# Patient Record
Sex: Male | Born: 1940
Health system: Southern US, Community
[De-identification: ages and names within clinical notes are randomized; demographics above are authoritative.]

## PROBLEM LIST (undated history)

## (undated) DIAGNOSIS — M109 Gout, unspecified: Secondary | ICD-10-CM

## (undated) DIAGNOSIS — M722 Plantar fascial fibromatosis: Secondary | ICD-10-CM

## (undated) DIAGNOSIS — K219 Gastro-esophageal reflux disease without esophagitis: Secondary | ICD-10-CM

## (undated) DIAGNOSIS — C439 Malignant melanoma of skin, unspecified: Secondary | ICD-10-CM

## (undated) DIAGNOSIS — J309 Allergic rhinitis, unspecified: Secondary | ICD-10-CM

## (undated) DIAGNOSIS — R0602 Shortness of breath: Secondary | ICD-10-CM

## (undated) DIAGNOSIS — E785 Hyperlipidemia, unspecified: Secondary | ICD-10-CM

## (undated) DIAGNOSIS — N529 Male erectile dysfunction, unspecified: Secondary | ICD-10-CM

## (undated) DIAGNOSIS — E119 Type 2 diabetes mellitus without complications: Secondary | ICD-10-CM

## (undated) DIAGNOSIS — I4891 Unspecified atrial fibrillation: Secondary | ICD-10-CM

## (undated) DIAGNOSIS — I10 Essential (primary) hypertension: Secondary | ICD-10-CM

## (undated) DIAGNOSIS — M1990 Unspecified osteoarthritis, unspecified site: Secondary | ICD-10-CM

## (undated) DIAGNOSIS — M545 Low back pain, unspecified: Secondary | ICD-10-CM

## (undated) DIAGNOSIS — J439 Emphysema, unspecified: Secondary | ICD-10-CM

## (undated) DIAGNOSIS — IMO0001 Reserved for inherently not codable concepts without codable children: Secondary | ICD-10-CM

## (undated) DIAGNOSIS — J449 Chronic obstructive pulmonary disease, unspecified: Secondary | ICD-10-CM

## (undated) DIAGNOSIS — F17200 Nicotine dependence, unspecified, uncomplicated: Secondary | ICD-10-CM

## (undated) HISTORY — DX: Nicotine dependence, unspecified, uncomplicated: F17.200

## (undated) HISTORY — DX: Essential (primary) hypertension: I10

## (undated) HISTORY — DX: Emphysema, unspecified: J43.9

## (undated) HISTORY — DX: Reserved for inherently not codable concepts without codable children: IMO0001

## (undated) HISTORY — DX: Chronic obstructive pulmonary disease, unspecified: J44.9

## (undated) HISTORY — DX: Male erectile dysfunction, unspecified: N52.9

## (undated) HISTORY — DX: Gastro-esophageal reflux disease without esophagitis: K21.9

## (undated) HISTORY — PX: CATARACT EXTRACTION: SUR2

## (undated) HISTORY — PX: LITHOTRIPSY: SUR834

## (undated) HISTORY — PX: EYE SURGERY: SHX253

## (undated) HISTORY — DX: Type 2 diabetes mellitus without complications: E11.9

## (undated) HISTORY — DX: Plantar fascial fibromatosis: M72.2

## (undated) HISTORY — DX: Gout, unspecified: M10.9

## (undated) HISTORY — DX: Allergic rhinitis, unspecified: J30.9

## (undated) HISTORY — PX: FRACTURE SURGERY: SHX138

## (undated) HISTORY — DX: Low back pain, unspecified: M54.50

## (undated) HISTORY — DX: Unspecified osteoarthritis, unspecified site: M19.90

## (undated) HISTORY — DX: Unspecified atrial fibrillation: I48.91

## (undated) HISTORY — DX: Low back pain: M54.5

## (undated) HISTORY — DX: Shortness of breath: R06.02

## (undated) HISTORY — DX: Malignant melanoma of skin, unspecified: C43.9

---

## 1999-08-28 ENCOUNTER — Encounter: Payer: Self-pay | Admitting: Rheumatology

## 1999-08-28 ENCOUNTER — Encounter: Admission: RE | Admit: 1999-08-28 | Discharge: 1999-08-28 | Payer: Self-pay | Admitting: Rheumatology

## 2002-06-07 DIAGNOSIS — M722 Plantar fascial fibromatosis: Secondary | ICD-10-CM

## 2002-06-07 HISTORY — DX: Plantar fascial fibromatosis: M72.2

## 2003-07-09 ENCOUNTER — Encounter: Admission: RE | Admit: 2003-07-09 | Discharge: 2003-07-09 | Payer: Self-pay | Admitting: Orthopaedic Surgery

## 2003-07-13 ENCOUNTER — Encounter: Admission: RE | Admit: 2003-07-13 | Discharge: 2003-07-13 | Payer: Self-pay | Admitting: Orthopaedic Surgery

## 2010-01-05 DIAGNOSIS — C439 Malignant melanoma of skin, unspecified: Secondary | ICD-10-CM

## 2010-01-05 HISTORY — PX: OTHER SURGICAL HISTORY: SHX169

## 2010-01-05 HISTORY — DX: Malignant melanoma of skin, unspecified: C43.9

## 2012-02-06 HISTORY — PX: OTHER SURGICAL HISTORY: SHX169

## 2012-06-07 DIAGNOSIS — I4891 Unspecified atrial fibrillation: Secondary | ICD-10-CM

## 2012-06-07 HISTORY — DX: Unspecified atrial fibrillation: I48.91

## 2013-03-26 ENCOUNTER — Emergency Department
Admission: EM | Admit: 2013-03-26 | Discharge: 2013-03-26 | Disposition: A | Discharge status: Home / Self Care | Payer: Medicare Other | Source: Home / Self Care | Attending: Family Medicine | Admitting: Family Medicine

## 2013-03-26 ENCOUNTER — Encounter: Payer: Self-pay | Admitting: Emergency Medicine

## 2013-03-26 ENCOUNTER — Emergency Department (INDEPENDENT_AMBULATORY_CARE_PROVIDER_SITE_OTHER): Payer: Medicare Other

## 2013-03-26 DIAGNOSIS — M25469 Effusion, unspecified knee: Secondary | ICD-10-CM

## 2013-03-26 DIAGNOSIS — M25561 Pain in right knee: Secondary | ICD-10-CM

## 2013-03-26 DIAGNOSIS — M25569 Pain in unspecified knee: Secondary | ICD-10-CM

## 2013-03-26 HISTORY — DX: Essential (primary) hypertension: I10

## 2013-03-26 HISTORY — DX: Hyperlipidemia, unspecified: E78.5

## 2013-03-26 MED ORDER — DICLOFENAC EPOLAMINE 1.3 % TD PTCH: MEDICATED_PATCH | TRANSDERMAL | Status: DC

## 2013-03-26 NOTE — ED Notes (Signed)
Lance Davenport c/o right knee pain intermittent without injury x 2-3 months. Pain radiates to toes at times.

## 2013-03-26 NOTE — ED Provider Notes (Signed)
CSN: [id number]     Arrival date & time 03/26/13  1210 History   First MD Initiated Contact with Patient 03/26/13 1236     Chief Complaint  Patient presents with  . Knee Pain     HPI Comments: Patient complains of intermittent pain in his right knee for about 3 months.  The pain increased about 2 weeks ago, with swelling of the knee but no warmth.  The pain improved, then became worse again 3 days ago.  The pain is worse at night.  He recalls no trauma to the knee.  The knee occasionally feels as if it may give way.  He has tried small doses of Aleve without inprovement. He has a past history of right femur fracture with ORIF.  Patient is a 72 y.o. male presenting with knee pain. The history is provided by the patient.  Knee Pain Location:  Knee Time since incident:  3 months Injury: no   Knee location:  R knee Pain details:    Quality:  Aching   Radiates to: toes.   Severity:  Mild   Onset quality:  Gradual   Duration:  3 months   Timing:  Intermittent   Progression:  Worsening Chronicity:  Recurrent Prior injury to area:  No Relieved by:  Nothing Worsened by:  Bearing weight and activity Ineffective treatments:  NSAIDs Associated symptoms: stiffness and swelling   Associated symptoms: no back pain, no decreased ROM, no fever, no muscle weakness, no numbness and no tingling     Past Medical History  Diagnosis Date  . Hypertension   . Hyperlipidemia    Past Surgical History  Procedure Laterality Date  . Fracture surgery      left leg with Pin  . Lithotripsy     Family History  Problem Relation Age of Onset  . Hypertension Mother   . Diabetes Mother   . Hypertension Father    History  Substance Use Topics  . Smoking status: Former Games developer  . Smokeless tobacco: Never Used  . Alcohol Use: Yes    Review of Systems  Constitutional: Negative for fever.  Musculoskeletal: Positive for stiffness. Negative for back pain.  All other systems reviewed and are  negative.    Allergies  Review of patient's allergies indicates no known allergies.  Home Medications   Current Outpatient Rx  Name  Route  Sig  Dispense  Refill  . allopurinol (ZYLOPRIM) 100 MG tablet   Oral   Take 100 mg by mouth daily.         . dabigatran (PRADAXA) 150 MG CAPS capsule   Oral   Take 150 mg by mouth every 12 (twelve) hours.         Marland Kitchen diltiazem (TIAZAC) 240 MG 24 hr capsule   Oral   Take 240 mg by mouth daily.         . rosuvastatin (CRESTOR) 5 MG tablet   Oral   Take 5 mg by mouth daily.         . valsartan (DIOVAN) 160 MG tablet   Oral   Take 160 mg by mouth daily.         . diclofenac (FLECTOR) 1.3 % PTCH      Apply one-half to one patch to right knee at bedtime.  Take off 12 hours later.   5 patch   0    BP 169/83  Pulse 76  Resp 16  Ht 6' (1.829 m)  Wt 265 lb (120.203  kg)  BMI 35.93 kg/m2  SpO2 96% Physical Exam  Nursing note and vitals reviewed. Constitutional: He is oriented to person, place, and time. He appears well-developed and well-nourished. No distress.  Patient is obese (BMI 35.9)  HENT:  Head: Normocephalic.  Mouth/Throat: Oropharynx is clear and moist.  Eyes: Conjunctivae are normal. Pupils are equal, round, and reactive to light.  Cardiovascular: Normal heart sounds.   Pulmonary/Chest: Breath sounds normal.  Musculoskeletal: Normal range of motion.       Right knee: He exhibits normal range of motion, no swelling, no effusion, no ecchymosis, no deformity, no laceration, no erythema, normal alignment, no LCL laxity, normal patellar mobility, no bony tenderness, normal meniscus and no MCL laxity. Tenderness found. Medial joint line tenderness noted. No lateral joint line, no MCL, no LCL and no patellar tendon tenderness noted.  Right knee has mild vague tenderness over the medial joint line.  Knee stable.  Negative McMurray test.  By inspection, the right lower extremity appears to be approximately 1.5cm longer  than the left.  Neurological: He is alert and oriented to person, place, and time.  Skin: Skin is warm and dry. No rash noted.    ED Course  Procedures  none    Imaging Review Dg Knee Complete 4 Views Right  03/26/2013   CLINICAL DATA:  Swelling, posterior knee pain  EXAM: RIGHT KNEE - COMPLETE 4+ VIEW  COMPARISON:  None.  FINDINGS: Four views of right knee submitted. No acute fracture or subluxation. Narrowing of patellofemoral joint space. Mild spurring of patella.  IMPRESSION: No acute fracture or subluxation. Mild degenerative changes.   Electronically Signed   By: Natasha Mead M.D.   On: 03/26/2013 13:18      MDM   1. Right knee pain; suspect DJD, possibly a result of leg length discrepancy    Applied hinged knee brace. Begin a trial of topically applied Flector patch at bedtime, which should result in minimal systemic absorption of diclofenac(Rx #5, with no refill) Followup with [facility] if not improving about two weeks.     Lattie Haw, MD 03/26/13 [phone]

## 2013-03-29 ENCOUNTER — Encounter: Payer: Self-pay | Admitting: Sports Medicine

## 2013-03-29 ENCOUNTER — Ambulatory Visit (INDEPENDENT_AMBULATORY_CARE_PROVIDER_SITE_OTHER): Payer: Medicare Other | Admitting: Sports Medicine

## 2013-03-29 VITALS — BP 147/77 | HR 92 | Wt 266.0 lb

## 2013-03-29 DIAGNOSIS — IMO0002 Reserved for concepts with insufficient information to code with codable children: Secondary | ICD-10-CM

## 2013-03-29 DIAGNOSIS — M1710 Unilateral primary osteoarthritis, unspecified knee: Secondary | ICD-10-CM

## 2013-03-29 DIAGNOSIS — M1711 Unilateral primary osteoarthritis, right knee: Secondary | ICD-10-CM

## 2013-03-29 DIAGNOSIS — M170 Bilateral primary osteoarthritis of knee: Secondary | ICD-10-CM | POA: Insufficient documentation

## 2013-03-29 MED ORDER — MELOXICAM 15 MG PO TABS: ORAL_TABLET | ORAL | Status: DC

## 2013-03-29 NOTE — Progress Notes (Signed)
  Subjective:    CC: Right knee pain and swelling  HPI:  This is a very pleasant 72 year old male, he has a history of knee osteoarthritis, unfortunately he developed increasing pain, swelling. He has tried some oral analgesics and anti-inflammatories without improvement. Pain is localized at the joint lines, no mechanical symptoms, worse with weight bearing, no radiation. Pain is moderate, persistent.  Past medical history, Surgical history, Family history not pertinant except as noted below, Social history, Allergies, and medications have been entered into the medical record, reviewed, and no changes needed.   Review of Systems: No headache, visual changes, nausea, vomiting, diarrhea, constipation, dizziness, abdominal pain, skin rash, fevers, chills, night sweats, swollen lymph nodes, weight loss, chest pain, body aches, joint swelling, muscle aches, shortness of breath, mood changes, visual or auditory hallucinations.  Objective:    General: Well Developed, well nourished, and in no acute distress.  Neuro: Alert and oriented x3, extra-ocular muscles intact, sensation grossly intact.  HEENT: Normocephalic, atraumatic, pupils equal round reactive to light, neck supple, no masses, no lymphadenopathy, thyroid nonpalpable.  Skin: Warm and dry, no rashes noted.  Cardiac: Regular rate and rhythm, no murmurs rubs or gallops.  Respiratory: Clear to auscultation bilaterally. Not using accessory muscles, speaking in full sentences.  Abdominal: Soft, nontender, nondistended, positive bowel sounds, no masses, no organomegaly.  Right Knee: Visible and palpable effusion with fluid wave. Mild tenderness to palpation over the medial and lateral joint lines. ROM full in flexion and extension and lower leg rotation. Ligaments with solid consistent endpoints including ACL, PCL, LCL, MCL. Negative Mcmurray's, Apley's, and Thessalonian tests. Non painful patellar compression. Patellar glide without  crepitus. Patellar and quadriceps tendons unremarkable. Hamstring and quadriceps strength is normal.   Procedure: Real-time Ultrasound Guided aspiration/Injection of right knee Device: GE Logiq E  Verbal informed consent obtained.  Time-out conducted.  Noted no overlying erythema, induration, or other signs of local infection.  Skin prepped in a sterile fashion.  Local anesthesia: Topical Ethyl chloride.  With sterile technique and under real time ultrasound guidance:  22-gauge needle advanced into the suprapatellar recess, 27 cc of straw-colored fluid was aspirated, syringe switched in 2 cc Kenalog 40, 4 cc lidocaine injected easily. Completed without difficulty  Pain immediately resolved suggesting accurate placement of the medication.  Advised to call if fevers/chills, erythema, induration, drainage, or persistent bleeding.  Images permanently stored and available for review in the ultrasound unit.  Impression: Technically successful ultrasound guided injection.  Impression and Recommendations:    The patient was counselled, risk factors were discussed, anticipatory guidance given.

## 2013-03-29 NOTE — Assessment & Plan Note (Signed)
Aspiration of 22 cc, injection as above. Meloxicam as needed. Home exercises. Return in one month to see how things are going.

## 2013-04-24 ENCOUNTER — Encounter: Payer: Self-pay | Admitting: Sports Medicine

## 2013-04-24 ENCOUNTER — Ambulatory Visit (INDEPENDENT_AMBULATORY_CARE_PROVIDER_SITE_OTHER): Payer: Medicare Other | Admitting: Sports Medicine

## 2013-04-24 ENCOUNTER — Ambulatory Visit (INDEPENDENT_AMBULATORY_CARE_PROVIDER_SITE_OTHER): Payer: Medicare Other

## 2013-04-24 VITALS — BP 157/83 | HR 91 | Wt 264.0 lb

## 2013-04-24 DIAGNOSIS — M1711 Unilateral primary osteoarthritis, right knee: Secondary | ICD-10-CM

## 2013-04-24 DIAGNOSIS — M1710 Unilateral primary osteoarthritis, unspecified knee: Secondary | ICD-10-CM

## 2013-04-24 DIAGNOSIS — IMO0002 Reserved for concepts with insufficient information to code with codable children: Secondary | ICD-10-CM

## 2013-04-24 DIAGNOSIS — M25511 Pain in right shoulder: Secondary | ICD-10-CM

## 2013-04-24 DIAGNOSIS — M25519 Pain in unspecified shoulder: Secondary | ICD-10-CM

## 2013-04-24 DIAGNOSIS — M19019 Primary osteoarthritis, unspecified shoulder: Secondary | ICD-10-CM

## 2013-04-24 NOTE — Assessment & Plan Note (Signed)
Doing much better after injection, he did have a setback last week but overall doing well. I like to see him back in one month for this, he will do some more Mobic. If continues to have pain and swelling we can certainly switch to Visco supplementation.

## 2013-04-24 NOTE — Progress Notes (Signed)
  Subjective:    CC: Followup  HPI: Right knee osteoarthritis: One-month status post aspiration and injection, approximately 78% improved. he was doing fantastic until a few days ago, he over did it in the gym, and had some swelling. This is now starting to resolve.   Right shoulder pain: Worse over the deltoid, wakes him from sleep, worse with overhead activities, no neck pain, no radiation down into the hand, moderate, persistent.   Past medical history, Surgical history, Family history not pertinant except as noted below, Social history, Allergies, and medications have been entered into the medical record, reviewed, and no changes needed.   Review of Systems: No fevers, chills, night sweats, weight loss, chest pain, or shortness of breath.   Objective:    General: Well Developed, well nourished, and in no acute distress.  Neuro: Alert and oriented x3, extra-ocular muscles intact, sensation grossly intact.  HEENT: Normocephalic, atraumatic, pupils equal round reactive to light, neck supple, no masses, no lymphadenopathy, thyroid nonpalpable.  Skin: Warm and dry, no rashes. Cardiac: Regular rate and rhythm, no murmurs rubs or gallops, no lower extremity edema.  Respiratory: Clear to auscultation bilaterally. Not using accessory muscles, speaking in full sentences Right knee: Mild palpable effusion no joint line pain. Right Shoulder: Inspection reveals no abnormalities, atrophy or asymmetry. Palpation is normal with no tenderness over AC joint or bicipital groove. ROM is full in all planes. Rotator cuff strength normal throughout. Positive Neer and Hawkin's tests, empty can sign. Speeds and Yergason's tests normal. No labral pathology noted with negative Obrien's, negative clunk and good stability. Normal scapular function observed. No painful arc and no drop arm sign. No apprehension sign  Procedure: Real-time Ultrasound Guided Injection of right subacromial bursa Device: GE Logiq  E  Verbal informed consent obtained.  Time-out conducted.  Noted no overlying erythema, induration, or other signs of local infection.  Skin prepped in a sterile fashion.  Local anesthesia: Topical Ethyl chloride.  With sterile technique and under real time ultrasound guidance:  25-gauge needle advanced into the subacromial bursa skimming over top of the supraspinatus which appeared intact, 1 cc Kenalog 40, 3 cc lidocaine injected easily. Completed without difficulty  Pain immediately resolved suggesting accurate placement of the medication.  Advised to call if fevers/chills, erythema, induration, drainage, or persistent bleeding.  Images permanently stored and available for review in the ultrasound unit.  Impression: Technically successful ultrasound guided injection.  Impression and Recommendations:

## 2013-04-24 NOTE — Assessment & Plan Note (Signed)
Injection as above. Home exercises. Mobic. X-rays. Return in one month.

## 2013-04-26 ENCOUNTER — Ambulatory Visit: Payer: Medicare Other | Admitting: Sports Medicine

## 2013-05-08 ENCOUNTER — Ambulatory Visit (INDEPENDENT_AMBULATORY_CARE_PROVIDER_SITE_OTHER): Payer: Medicare Other | Admitting: Sports Medicine

## 2013-05-08 ENCOUNTER — Encounter: Payer: Self-pay | Admitting: Sports Medicine

## 2013-05-08 VITALS — BP 136/75 | HR 80 | Wt 260.0 lb

## 2013-05-08 DIAGNOSIS — M1711 Unilateral primary osteoarthritis, right knee: Secondary | ICD-10-CM

## 2013-05-08 DIAGNOSIS — M1710 Unilateral primary osteoarthritis, unspecified knee: Secondary | ICD-10-CM

## 2013-05-08 DIAGNOSIS — IMO0002 Reserved for concepts with insufficient information to code with codable children: Secondary | ICD-10-CM

## 2013-05-08 NOTE — Progress Notes (Signed)
  Subjective:    CC:  Followup  HPI: Right knee osteoarthritis : I recently performed an aspiration and injection, he did extremely well but unfortunately over did it, and had a recurrence of pain. I advised him to take it easy on the knee, and take some anti-inflammatories a week ago, unfortunately his symptoms have persisted and he returns for repeat intervention. Pain is localized to the medial joint line, moderate, persistent.  Past medical history, Surgical history, Family history not pertinant except as noted below, Social history, Allergies, and medications have been entered into the medical record, reviewed, and no changes needed.   Review of Systems: No fevers, chills, night sweats, weight loss, chest pain, or shortness of breath.   Objective:    General: Well Developed, well nourished, and in no acute distress.  Neuro: Alert and oriented x3, extra-ocular muscles intact, sensation grossly intact.  HEENT: Normocephalic, atraumatic, pupils equal round reactive to light, neck supple, no masses, no lymphadenopathy, thyroid nonpalpable.  Skin: Warm and dry, no rashes. Cardiac: Regular rate and rhythm, no murmurs rubs or gallops, no lower extremity edema.  Respiratory: Clear to auscultation bilaterally. Not using accessory muscles, speaking in full sentences. Right Knee: Only minimal palpable effusion. Tender to palpation of the medial joint line. ROM full in flexion and extension and lower leg rotation. Ligaments with solid consistent endpoints including ACL, PCL, LCL, MCL. Negative Mcmurray's, Apley's, and Thessalonian tests. Non painful patellar compression. Patellar glide without crepitus. Patellar and quadriceps tendons unremarkable. Hamstring and quadriceps strength is normal.   Procedure: Real-time Ultrasound Guided Injection of right knee Device: GE Logiq E  Verbal informed consent obtained.  Time-out conducted.  Noted no overlying erythema, induration, or other signs of  local infection.  Skin prepped in a sterile fashion.  Local anesthesia: Topical Ethyl chloride.  With sterile technique and under real time ultrasound guidance:  2 cc Kenalog 40, 4 cc lidocaine injected easily, syringe switched and 25 mg/2.5 mL of Supartz (sodium hyaluronate) in a prefilled syringe was injected easily into the knee through a 22-gauge needle. Completed without difficulty  Pain immediately resolved suggesting accurate placement of the medication.  Advised to call if fevers/chills, erythema, induration, drainage, or persistent bleeding.  Images permanently stored and available for review in the ultrasound unit.  Impression: Technically successful ultrasound guided injection.  Impression and Recommendations:

## 2013-05-08 NOTE — Assessment & Plan Note (Signed)
Repeat injection as above, and started viscous supplementation. Return in one week for injection #2.

## 2013-05-15 ENCOUNTER — Ambulatory Visit (INDEPENDENT_AMBULATORY_CARE_PROVIDER_SITE_OTHER): Payer: Medicare Other | Admitting: Sports Medicine

## 2013-05-15 ENCOUNTER — Encounter: Payer: Self-pay | Admitting: Sports Medicine

## 2013-05-15 VITALS — BP 128/71 | HR 83 | Wt 253.0 lb

## 2013-05-15 DIAGNOSIS — IMO0002 Reserved for concepts with insufficient information to code with codable children: Secondary | ICD-10-CM

## 2013-05-15 DIAGNOSIS — M1711 Unilateral primary osteoarthritis, right knee: Secondary | ICD-10-CM

## 2013-05-15 DIAGNOSIS — M1710 Unilateral primary osteoarthritis, unspecified knee: Secondary | ICD-10-CM

## 2013-05-15 NOTE — Progress Notes (Signed)
  Procedure: Real-time Ultrasound Guided Injection of right knee Device: GE Logiq E  Verbal informed consent obtained.  Time-out conducted.  Noted no overlying erythema, induration, or other signs of local infection.  Skin prepped in a sterile fashion.  Local anesthesia: Topical Ethyl chloride.  With sterile technique and under real time ultrasound guidance:  25 mg/2.5 mL of Supartz (sodium hyaluronate) in a prefilled syringe was injected easily into the knee through a 22-gauge needle.  Completed without difficulty  Pain immediately resolved suggesting accurate placement of the medication.  Advised to call if fevers/chills, erythema, induration, drainage, or persistent bleeding.  Images permanently stored and available for review in the ultrasound unit.  Impression: Technically successful ultrasound guided injection. 

## 2013-05-15 NOTE — Assessment & Plan Note (Signed)
Essentially pain-free with no swelling. Supartz injection #2 of 5 as above. Return in one week for #3 of 5.

## 2013-05-22 ENCOUNTER — Ambulatory Visit (INDEPENDENT_AMBULATORY_CARE_PROVIDER_SITE_OTHER): Payer: Medicare Other | Admitting: Sports Medicine

## 2013-05-22 ENCOUNTER — Encounter: Payer: Self-pay | Admitting: Sports Medicine

## 2013-05-22 VITALS — BP 125/70 | HR 85 | Wt 252.0 lb

## 2013-05-22 DIAGNOSIS — IMO0002 Reserved for concepts with insufficient information to code with codable children: Secondary | ICD-10-CM

## 2013-05-22 DIAGNOSIS — M1710 Unilateral primary osteoarthritis, unspecified knee: Secondary | ICD-10-CM

## 2013-05-22 DIAGNOSIS — M1711 Unilateral primary osteoarthritis, right knee: Secondary | ICD-10-CM

## 2013-05-22 NOTE — Progress Notes (Signed)
   Procedure: Real-time Ultrasound Guided Injection of right knee Device: GE Logiq E  Verbal informed consent obtained.  Time-out conducted.  Noted no overlying erythema, induration, or other signs of local infection.  Skin prepped in a sterile fashion.  Local anesthesia: Topical Ethyl chloride.  With sterile technique and under real time ultrasound guidance: 31 cc of straw-colored fluid aspirated, syringe switched and 25 mg/2.5 mL of Supartz (sodium hyaluronate) in a prefilled syringe was injected easily into the knee through a 22-gauge needle.  Completed without difficulty  Pain immediately resolved suggesting accurate placement of the medication.  Advised to call if fevers/chills, erythema, induration, drainage, or persistent bleeding.  Images permanently stored and available for review in the ultrasound unit.  Impression: Technically successful ultrasound guided injection.

## 2013-05-22 NOTE — Assessment & Plan Note (Signed)
Supartz #3 of 5. Return in one week for #4.

## 2013-05-29 ENCOUNTER — Ambulatory Visit (INDEPENDENT_AMBULATORY_CARE_PROVIDER_SITE_OTHER): Payer: Medicare Other | Admitting: Sports Medicine

## 2013-05-29 ENCOUNTER — Encounter: Payer: Self-pay | Admitting: Sports Medicine

## 2013-05-29 VITALS — BP 128/70 | HR 94 | Wt 260.0 lb

## 2013-05-29 DIAGNOSIS — IMO0002 Reserved for concepts with insufficient information to code with codable children: Secondary | ICD-10-CM

## 2013-05-29 DIAGNOSIS — M1710 Unilateral primary osteoarthritis, unspecified knee: Secondary | ICD-10-CM

## 2013-05-29 DIAGNOSIS — M1711 Unilateral primary osteoarthritis, right knee: Secondary | ICD-10-CM

## 2013-05-29 NOTE — Assessment & Plan Note (Signed)
Currently pain free. Supartz injection #4 or 5, return in one week #5 of 5.

## 2013-05-29 NOTE — Progress Notes (Signed)
   Procedure: Real-time Ultrasound Guided Injection of right knee Device: GE Logiq E  Verbal informed consent obtained.  Time-out conducted.  Noted no overlying erythema, induration, or other signs of local infection.  Skin prepped in a sterile fashion.  Local anesthesia: Topical Ethyl chloride.  With sterile technique and under real time ultrasound guidance: 37 cc of straw-colored fluid aspirated, syringe switched and 25 mg/2.5 mL of Supartz (sodium hyaluronate) in a prefilled syringe was injected easily into the knee through a 22-gauge needle.  Completed without difficulty  Pain immediately resolved suggesting accurate placement of the medication.  Advised to call if fevers/chills, erythema, induration, drainage, or persistent bleeding.  Images permanently stored and available for review in the ultrasound unit.  Impression: Technically successful ultrasound guided injection.

## 2013-06-05 ENCOUNTER — Encounter: Payer: Self-pay | Admitting: Sports Medicine

## 2013-06-05 ENCOUNTER — Ambulatory Visit (INDEPENDENT_AMBULATORY_CARE_PROVIDER_SITE_OTHER): Payer: Medicare Other | Admitting: Sports Medicine

## 2013-06-05 VITALS — BP 120/73 | HR 101 | Wt 253.0 lb

## 2013-06-05 DIAGNOSIS — M25511 Pain in right shoulder: Secondary | ICD-10-CM

## 2013-06-05 DIAGNOSIS — M1710 Unilateral primary osteoarthritis, unspecified knee: Secondary | ICD-10-CM

## 2013-06-05 DIAGNOSIS — IMO0002 Reserved for concepts with insufficient information to code with codable children: Secondary | ICD-10-CM

## 2013-06-05 DIAGNOSIS — M25519 Pain in unspecified shoulder: Secondary | ICD-10-CM

## 2013-06-05 DIAGNOSIS — M1711 Unilateral primary osteoarthritis, right knee: Secondary | ICD-10-CM

## 2013-06-05 NOTE — Progress Notes (Signed)
  Procedure: Real-time Ultrasound Guided Injection of right knee Device: GE Logiq E  Verbal informed consent obtained.  Time-out conducted.  Noted no overlying erythema, induration, or other signs of local infection.  Skin prepped in a sterile fashion.  Local anesthesia: Topical Ethyl chloride.  With sterile technique and under real time ultrasound guidance: 39 cc of straw-colored fluid aspirated, syringe switched and 25 mg/2.5 mL of Supartz (sodium hyaluronate) in a prefilled syringe was injected easily into the knee through a 22-gauge needle.  Completed without difficulty  Pain immediately resolved suggesting accurate placement of the medication.  Advised to call if fevers/chills, erythema, induration, drainage, or persistent bleeding.  Images permanently stored and available for review in the ultrasound unit.  Impression: Technically successful ultrasound guided injection.

## 2013-06-05 NOTE — Assessment & Plan Note (Signed)
Supartz injection 5 of 5 as above. Return in 1 month to recheck. 

## 2013-06-05 NOTE — Assessment & Plan Note (Deleted)
Supartz injection 5 of 5 as above. Return in 1 month to recheck.

## 2013-06-20 ENCOUNTER — Encounter: Payer: Self-pay | Admitting: Emergency Medicine

## 2013-06-20 ENCOUNTER — Emergency Department
Admission: EM | Admit: 2013-06-20 | Discharge: 2013-06-20 | Disposition: A | Discharge status: Home / Self Care | Payer: Medicare Other | Source: Home / Self Care | Attending: Family Medicine | Admitting: Family Medicine

## 2013-06-20 DIAGNOSIS — L738 Other specified follicular disorders: Secondary | ICD-10-CM

## 2013-06-20 DIAGNOSIS — L739 Follicular disorder, unspecified: Secondary | ICD-10-CM

## 2013-06-20 MED ORDER — PREDNISONE 20 MG PO TABS: 20.0000 mg | ORAL_TABLET | Freq: Two times a day (BID) | ORAL | Status: DC

## 2013-06-20 MED ORDER — CEPHALEXIN 500 MG PO CAPS: 500.0000 mg | ORAL_CAPSULE | Freq: Two times a day (BID) | ORAL | Status: DC

## 2013-06-20 NOTE — ED Provider Notes (Signed)
CSN: [id number]     Arrival date & time 06/20/13  5176 History   First MD Initiated Contact with Patient 06/20/13 [phone]     Chief Complaint  Patient presents with  . Rash      HPI Comments: Patient complains of a pruritic rash on his abdomen and back for about four days.  He feels well otherwise. He reports that he sits in a hot tub at his house daily for arthritis, but he states that he has been doing this for years without adverse effect.  Patient is a 73 y.o. male presenting with rash. The history is provided by the patient.  Rash Pain location: trunk. Pain quality comment:  Itching Pain severity:  Mild Onset quality:  Sudden Duration:  4 days Timing:  Constant Progression:  Unchanged Chronicity:  New Relieved by:  Nothing Worsened by:  Nothing tried Ineffective treatments:  None tried Associated symptoms: no chills, no cough, no fatigue, no fever and no sore throat   Risk factors: obesity     Past Medical History  Diagnosis Date  . Hypertension   . Hyperlipidemia    Past Surgical History  Procedure Laterality Date  . Fracture surgery      left leg with Pin  . Lithotripsy     Family History  Problem Relation Age of Onset  . Hypertension Mother   . Diabetes Mother   . Hypertension Father    History  Substance Use Topics  . Smoking status: Former Research scientist (life sciences)  . Smokeless tobacco: Never Used  . Alcohol Use: Yes    Review of Systems  Constitutional: Negative for fever, chills and fatigue.  HENT: Negative for sore throat.   Respiratory: Negative for cough.   Skin: Positive for rash.    Allergies  Review of patient's allergies indicates no known allergies.  Home Medications   Current Outpatient Rx  Name  Route  Sig  Dispense  Refill  . allopurinol (ZYLOPRIM) 100 MG tablet   Oral   Take 100 mg by mouth daily.         . cephALEXin (KEFLEX) 500 MG capsule   Oral   Take 1 capsule (500 mg total) by mouth 2 (two) times daily.   14 capsule   0   .  dabigatran (PRADAXA) 150 MG CAPS capsule   Oral   Take 150 mg by mouth every 12 (twelve) hours.         . diclofenac (FLECTOR) 1.3 % PTCH      Apply one-half to one patch to right knee at bedtime.  Take off 12 hours later.   5 patch   0   . diltiazem (TIAZAC) 240 MG 24 hr capsule   Oral   Take 240 mg by mouth daily.         . meloxicam (MOBIC) 15 MG tablet      One tab PO qAM with breakfast for 2 weeks, then daily prn pain.   30 tablet   3   . predniSONE (DELTASONE) 20 MG tablet   Oral   Take 1 tablet (20 mg total) by mouth 2 (two) times daily. Take with food.   10 tablet   0   . rosuvastatin (CRESTOR) 5 MG tablet   Oral   Take 5 mg by mouth daily.         . valsartan (DIOVAN) 160 MG tablet   Oral   Take 160 mg by mouth daily.  BP 165/94  Pulse 97  Temp(Src) 98.1 F (36.7 C) (Oral)  Resp 18  Ht 6' (1.829 m)  Wt 261 lb (118.389 kg)  BMI 35.39 kg/m2  SpO2 97% Physical Exam  Nursing note and vitals reviewed. Constitutional: He is oriented to person, place, and time. He appears well-developed and well-nourished. No distress.  Patient is obese (BMI 35.4)  HENT:  Head: Normocephalic.  Nose: Nose normal.  Mouth/Throat: Oropharynx is clear and moist.  Eyes: Conjunctivae are normal. Pupils are equal, round, and reactive to light.  Neck: Neck supple.  Cardiovascular: Normal heart sounds.   Pulmonary/Chest: Breath sounds normal.  Abdominal: Soft.  Lymphadenopathy:    He has no cervical adenopathy.  Neurological: He is alert and oriented to person, place, and time.  Skin: Skin is warm and dry. Rash noted.     Anterior chest/abdomen and back have numerous inflamed hair follicles without pustules.  Several small excoriated follicles noted.  Most involved follicles have a collar of erythema about 21mm dia.       ED Course  Procedures none    Labs Reviewed  WOUND CULTURE         MDM   1. Folliculitis (doubt "hot tub" folliculitis)     Wound culture pending from excoriated follicle on anterior chest. Begin Keflex.  Prednisone burst. May take Benadryl at bedtime for itching. Begin proper skin care:  Minimize baths/showers.  Use a mild bath soap containing oil such as unscented Dove.  Apply a moisturizing cream or lotion immediately after bathing while still wet, then towel dry.  Followup with dermatologist if not improved one week.   Kandra Nicolas, MD 06/20/13 [phone]

## 2013-06-20 NOTE — Discharge Instructions (Signed)
May take Benadryl at bedtime for itching. Begin proper skin care:  Minimize baths/showers.  Use a mild bath soap containing oil such as unscented Dove.  Apply a moisturizing cream or lotion immediately after bathing while still wet, then towel dry.    Folliculitis  Folliculitis is redness, soreness, and swelling (inflammation) of the hair follicles. This condition can occur anywhere on the body. People with weakened immune systems, diabetes, or obesity have a greater risk of getting folliculitis. CAUSES  Bacterial infection. This is the most common cause.  Fungal infection.  Viral infection.  Contact with certain chemicals, especially oils and tars. Long-term folliculitis can result from bacteria that live in the nostrils. The bacteria may trigger multiple outbreaks of folliculitis over time. SYMPTOMS Folliculitis most commonly occurs on the scalp, thighs, legs, back, buttocks, and areas where hair is shaved frequently. An early sign of folliculitis is a small, white or yellow, pus-filled, itchy lesion (pustule). These lesions appear on a red, inflamed follicle. They are usually less than 0.2 inches (5 mm) wide. When there is an infection of the follicle that goes deeper, it becomes a boil or furuncle. A group of closely packed boils creates a larger lesion (carbuncle). Carbuncles tend to occur in hairy, sweaty areas of the body. DIAGNOSIS  Your caregiver can usually tell what is wrong by doing a physical exam. A sample may be taken from one of the lesions and tested in a lab. This can help determine what is causing your folliculitis. TREATMENT  Treatment may include:  Applying warm compresses to the affected areas.  Taking antibiotic medicines orally or applying them to the skin.  Draining the lesions if they contain a large amount of pus or fluid.  Laser hair removal for cases of long-lasting folliculitis. This helps to prevent regrowth of the hair. HOME CARE INSTRUCTIONS  Apply  warm compresses to the affected areas as directed by your caregiver.  If antibiotics are prescribed, take them as directed. Finish them even if you start to feel better.  You may take over-the-counter medicines to relieve itching.  Do not shave irritated skin.  Follow up with your caregiver as directed. SEEK IMMEDIATE MEDICAL CARE IF:   You have increasing redness, swelling, or pain in the affected area.  You have a fever. MAKE SURE YOU:  Understand these instructions.  Will watch your condition.  Will get help right away if you are not doing well or get worse. Document Released: 08/02/2001 Document Revised: 11/23/2011 Document Reviewed: 08/24/2011 [facility] Patient Information 2014 [place], [place].

## 2013-06-20 NOTE — ED Notes (Signed)
Pt c/o itchy rash all over, worse on his back x 4 days. Denies fever.

## 2013-06-23 LAB — WOUND CULTURE: GRAM STAIN: NONE SEEN

## 2013-06-25 ENCOUNTER — Telehealth: Payer: Self-pay | Admitting: *Deleted

## 2013-06-29 ENCOUNTER — Telehealth: Payer: Self-pay | Admitting: *Deleted

## 2013-07-06 ENCOUNTER — Ambulatory Visit (INDEPENDENT_AMBULATORY_CARE_PROVIDER_SITE_OTHER): Payer: Medicare Other | Admitting: Sports Medicine

## 2013-07-06 ENCOUNTER — Encounter: Payer: Self-pay | Admitting: Sports Medicine

## 2013-07-06 VITALS — BP 142/74 | HR 93 | Ht 72.0 in | Wt 263.0 lb

## 2013-07-06 DIAGNOSIS — M1711 Unilateral primary osteoarthritis, right knee: Secondary | ICD-10-CM

## 2013-07-06 DIAGNOSIS — M1710 Unilateral primary osteoarthritis, unspecified knee: Secondary | ICD-10-CM

## 2013-07-06 DIAGNOSIS — L111 Transient acantholytic dermatosis [Grover]: Secondary | ICD-10-CM | POA: Insufficient documentation

## 2013-07-06 DIAGNOSIS — R21 Rash and other nonspecific skin eruption: Secondary | ICD-10-CM

## 2013-07-06 DIAGNOSIS — IMO0002 Reserved for concepts with insufficient information to code with codable children: Secondary | ICD-10-CM

## 2013-07-06 MED ORDER — SULFAMETHOXAZOLE-TRIMETHOPRIM 800-160 MG PO TABS: 1.0000 | ORAL_TABLET | Freq: Two times a day (BID) | ORAL | Status: DC

## 2013-07-06 NOTE — Assessment & Plan Note (Signed)
I am going to add an additional course of antibiotics as an initial wound culture did show coagulase-negative Staphylococcus aureus. As this is been present for one month now despite steroids and an initial course of antibiotics, we are going to obtain a punch biopsy. Return in one week for suture removal.

## 2013-07-06 NOTE — Addendum Note (Signed)
Addended by: Doree Albee on: 07/06/2013 04:42 PM   Modules accepted: Orders

## 2013-07-06 NOTE — Progress Notes (Signed)
  Subjective:    CC: Followup  HPI: Knee osteoarthritis: Has finished 5 shots in the Supartz series, his knees are essentially pain-free. He does have some meloxicam at home which he has not been taking.  Skin rash : present for over one month now, predominantly pruritic. Localized over his abdomen and back, moderate, persistent. He has been to urgent care where a wound culture showed coagulase-negative Staphylococcus aureus, which was likely a contaminant by my suspicion. His rash has never been painful. He has been through course of prednisone, as well as doxycycline, nothing has improved his symptoms.  Past medical history, Surgical history, Family history not pertinant except as noted below, Social history, Allergies, and medications have been entered into the medical record, reviewed, and no changes needed.   Review of Systems: No fevers, chills, night sweats, weight loss, chest pain, or shortness of breath.   Objective:    General: Well Developed, well nourished, and in no acute distress.  Neuro: Alert and oriented x3, extra-ocular muscles intact, sensation grossly intact.  HEENT: Normocephalic, atraumatic, pupils equal round reactive to light, neck supple, no masses, no lymphadenopathy, thyroid nonpalpable.  Skin: Warm and dry, there is a scaly papular rash consisting of small macules over his abdomen and back. There is mild excoriation, there's no sign of bacterial superinfection, it is nontender. Cardiac: Regular rate and rhythm, no murmurs rubs or gallops, no lower extremity edema.  Respiratory: Clear to auscultation bilaterally. Not using accessory muscles, speaking in full sentences.  Procedure:  Excisional biopsy of  skin rash Risks, benefits, and alternatives explained and consent obtained. Time out conducted. Surface prepped with alcohol. 5cc lidocaine with epinephine infiltrated in a field block. Adequate anesthesia ensured. Area prepped and draped in a sterile  fashion. Excision performed with: 4 mm punch biopsy used to sample one of the lesions. I then placed 2 overlapping horizontal mattress 4-0 Ethilon sutures as the initial horizontal mattress at 1 suture did not appropriately oppose the edges of the wound. Hemostasis achieved. Pt stable.  Impression and Recommendations:

## 2013-07-06 NOTE — Assessment & Plan Note (Signed)
Doing well. He will use as needed Mobic.

## 2013-07-13 ENCOUNTER — Ambulatory Visit (INDEPENDENT_AMBULATORY_CARE_PROVIDER_SITE_OTHER): Payer: Medicare Other | Admitting: Sports Medicine

## 2013-07-13 ENCOUNTER — Encounter: Payer: Self-pay | Admitting: Sports Medicine

## 2013-07-13 VITALS — BP 131/71 | HR 91 | Ht 72.0 in | Wt 261.0 lb

## 2013-07-13 DIAGNOSIS — L111 Transient acantholytic dermatosis [Grover]: Secondary | ICD-10-CM

## 2013-07-13 DIAGNOSIS — L988 Other specified disorders of the skin and subcutaneous tissue: Secondary | ICD-10-CM

## 2013-07-13 NOTE — Assessment & Plan Note (Signed)
Failure of steroids and doxycycline. 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 Treatment is primarily with immunosuppressants, as he has failed steroids, he has a dermatologist and will make an appointment. Return as needed.

## 2013-07-13 NOTE — Progress Notes (Signed)
  Subjective:    CC: Follow up  HPI: Skin rash: This is maculopapular, localized over the trunk, mildly pruritic. He has failed a course of doxycycline and prednisone. We obtained a biopsy of the lesion previously pathology results show acantholytic dermatosis, this is consistent with Grover's disease.  Past medical history, Surgical history, Family history not pertinant except as noted below, Social history, Allergies, and medications have been entered into the medical record, reviewed, and no changes needed.   Review of Systems: No fevers, chills, night sweats, weight loss, chest pain, or shortness of breath.   Objective:    General: Well Developed, well nourished, and in no acute distress.  Neuro: Alert and oriented x3, extra-ocular muscles intact, sensation grossly intact.  HEENT: Normocephalic, atraumatic, pupils equal round reactive to light, neck supple, no masses, no lymphadenopathy, thyroid nonpalpable.  Skin: Warm and dry, diffuse maculopapular rash present on the trunk. Cardiac: Regular rate and rhythm, no murmurs rubs or gallops, no lower extremity edema.  Respiratory: Clear to auscultation bilaterally. Not using accessory muscles, speaking in full sentences.  Sutures were removed, incision and wound appears clean, dry, and intact.  Impression and Recommendations:

## 2013-08-15 ENCOUNTER — Other Ambulatory Visit: Payer: Self-pay | Admitting: Sports Medicine

## 2014-04-19 ENCOUNTER — Ambulatory Visit (HOSPITAL_COMMUNITY): Payer: Medicare Other | Attending: Cardiovascular Disease | Admitting: Cardiology

## 2014-04-19 ENCOUNTER — Other Ambulatory Visit (HOSPITAL_COMMUNITY): Payer: Self-pay | Admitting: Internal Medicine

## 2014-04-19 DIAGNOSIS — I712 Thoracic aortic aneurysm, without rupture: Secondary | ICD-10-CM

## 2014-04-19 DIAGNOSIS — I77810 Thoracic aortic ectasia: Secondary | ICD-10-CM

## 2014-04-19 NOTE — Progress Notes (Signed)
Echo performed. 

## 2014-05-08 ENCOUNTER — Telehealth: Payer: Self-pay

## 2014-05-08 ENCOUNTER — Encounter: Payer: Self-pay | Admitting: Sports Medicine

## 2014-05-08 NOTE — Telephone Encounter (Signed)
Letter in box. 

## 2014-05-08 NOTE — Telephone Encounter (Signed)
Patient called stated that he has Osteoartitis in both knees he is requesting a letter to be excused from [employer] duty. Patient stated that he can not sit for long period of time. Prakash Kimberling,CMA

## 2014-05-09 NOTE — Telephone Encounter (Signed)
Patient has been informed that letter is ready for pickup. Rhonda Cunningham,CMA  

## 2015-07-10 DIAGNOSIS — H524 Presbyopia: Secondary | ICD-10-CM | POA: Diagnosis not present

## 2015-07-14 DIAGNOSIS — N390 Urinary tract infection, site not specified: Secondary | ICD-10-CM | POA: Diagnosis not present

## 2015-08-04 DIAGNOSIS — N419 Inflammatory disease of prostate, unspecified: Secondary | ICD-10-CM | POA: Diagnosis not present

## 2015-09-08 IMAGING — CR DG KNEE COMPLETE 4+V*R*
4 series · 4 of 4 positions shown · non-contrast
Comparison: None.

CLINICAL DATA: Swelling, posterior knee pain

EXAM:
RIGHT KNEE - COMPLETE 4+ VIEW

[view not recorded (1 of 4)]
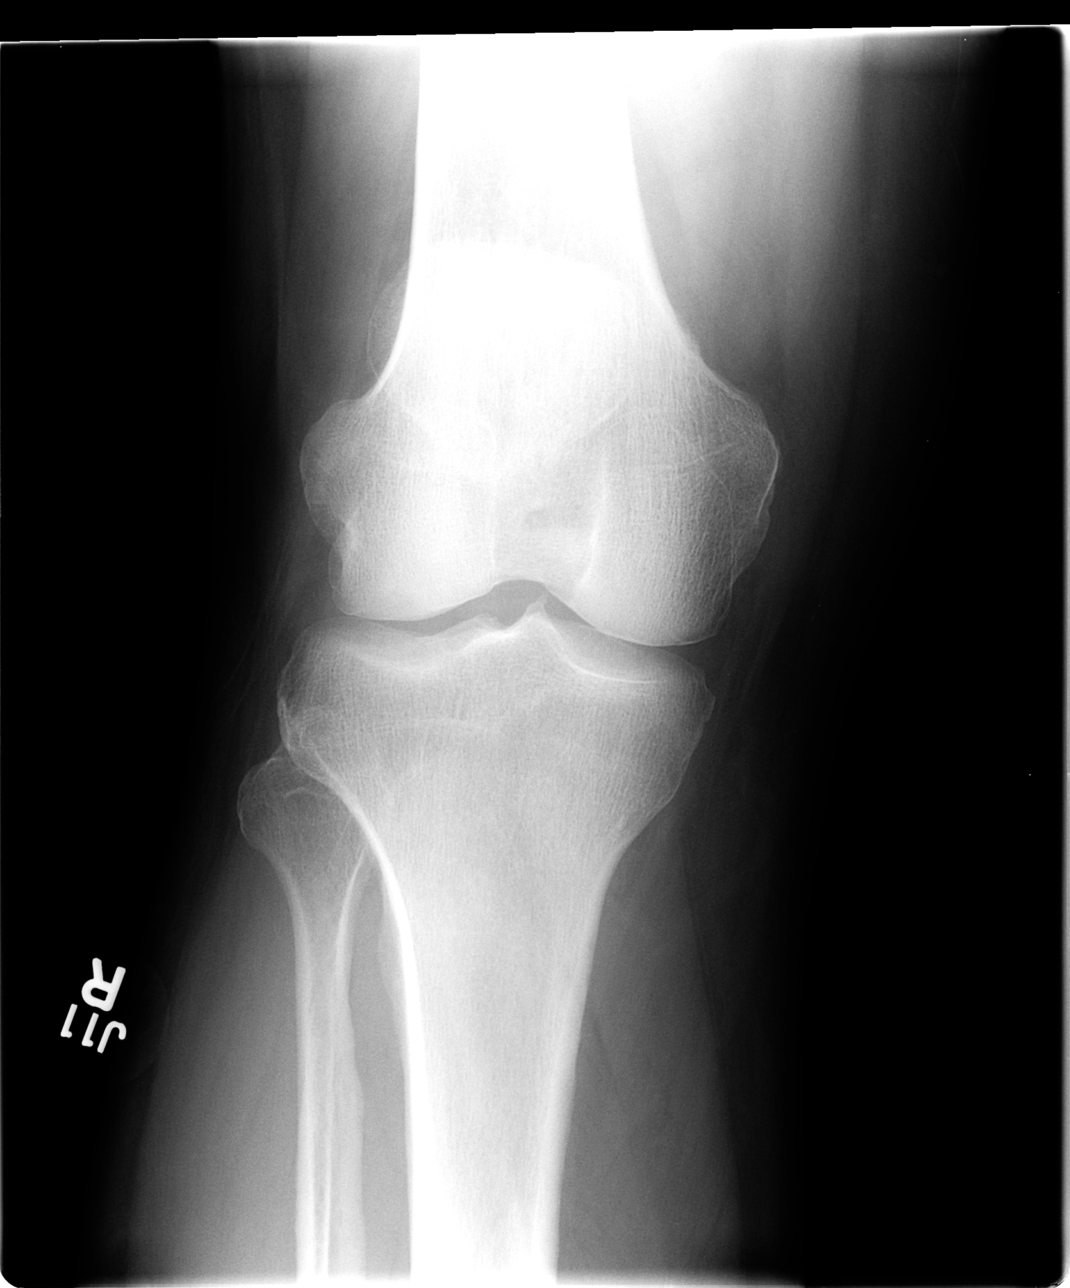

[view not recorded (2 of 4)]
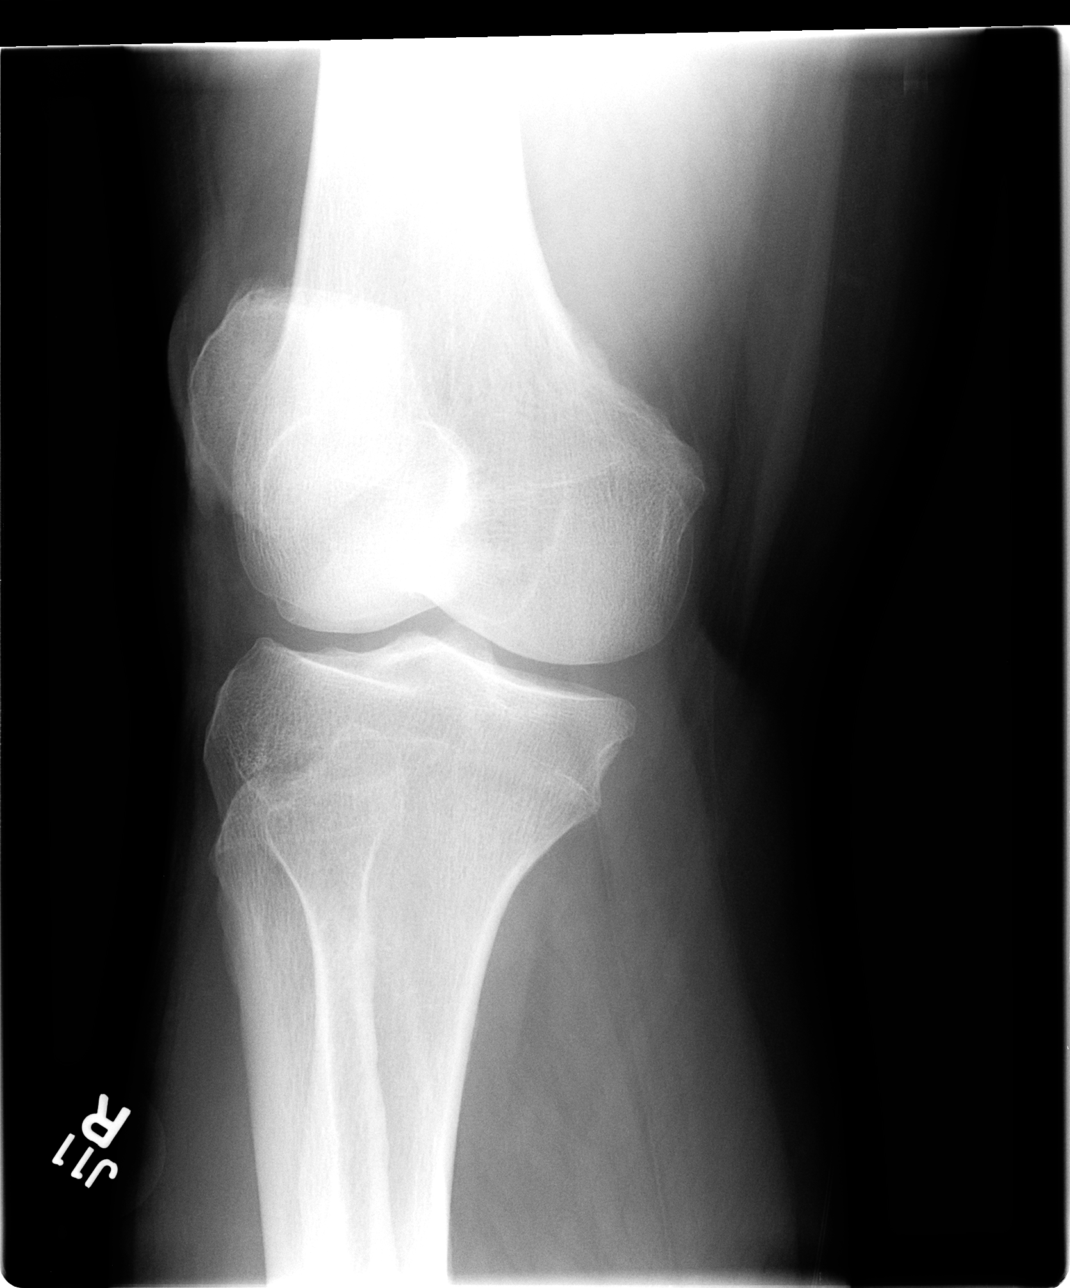

[view not recorded (3 of 4)]
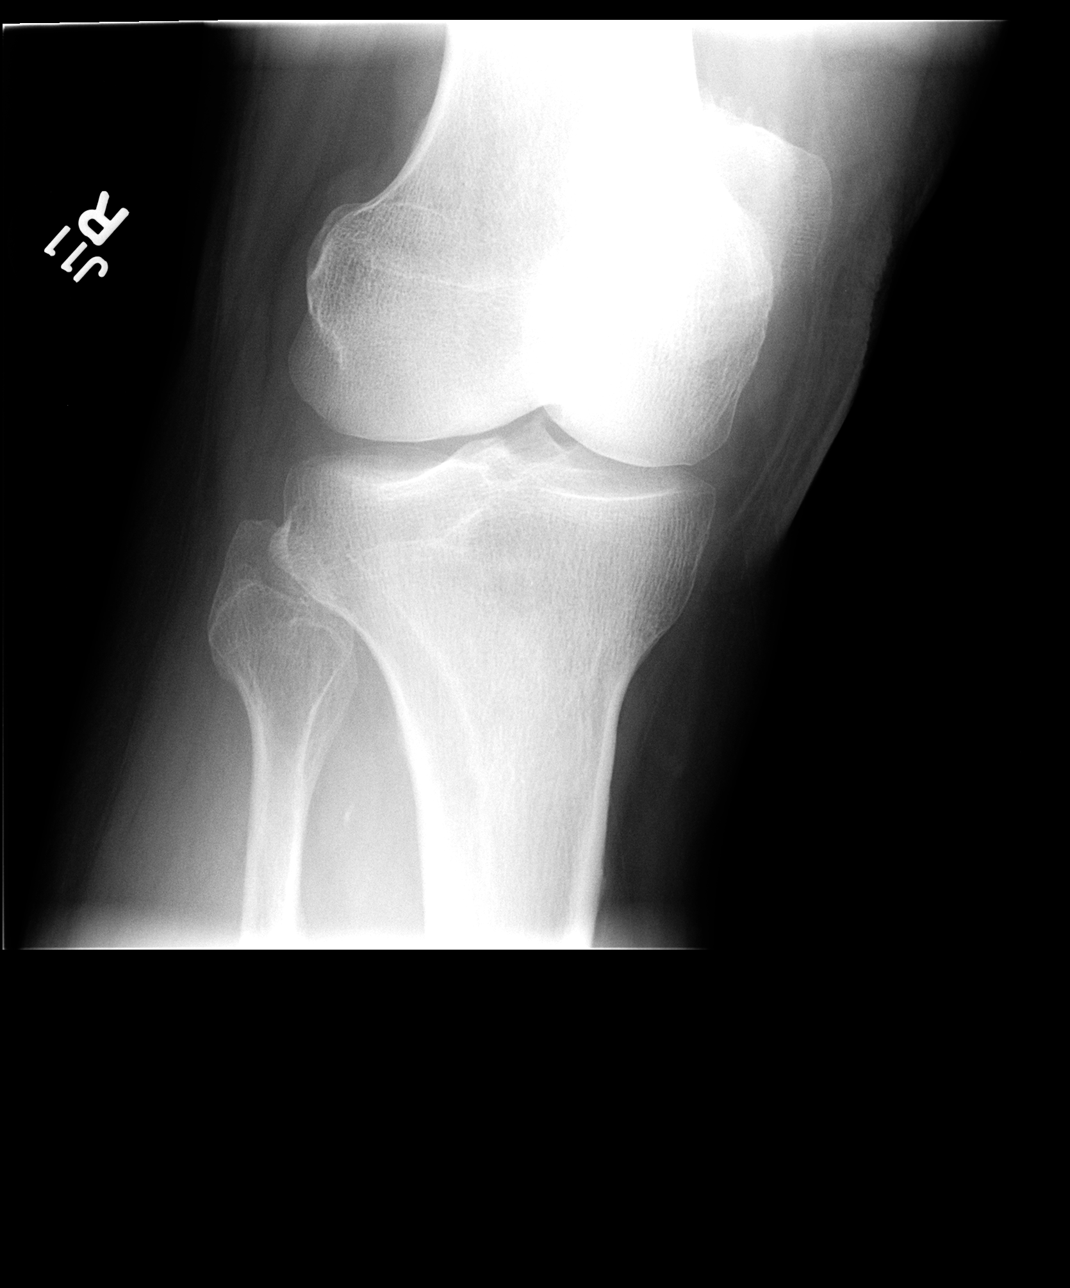

[view not recorded (4 of 4)]
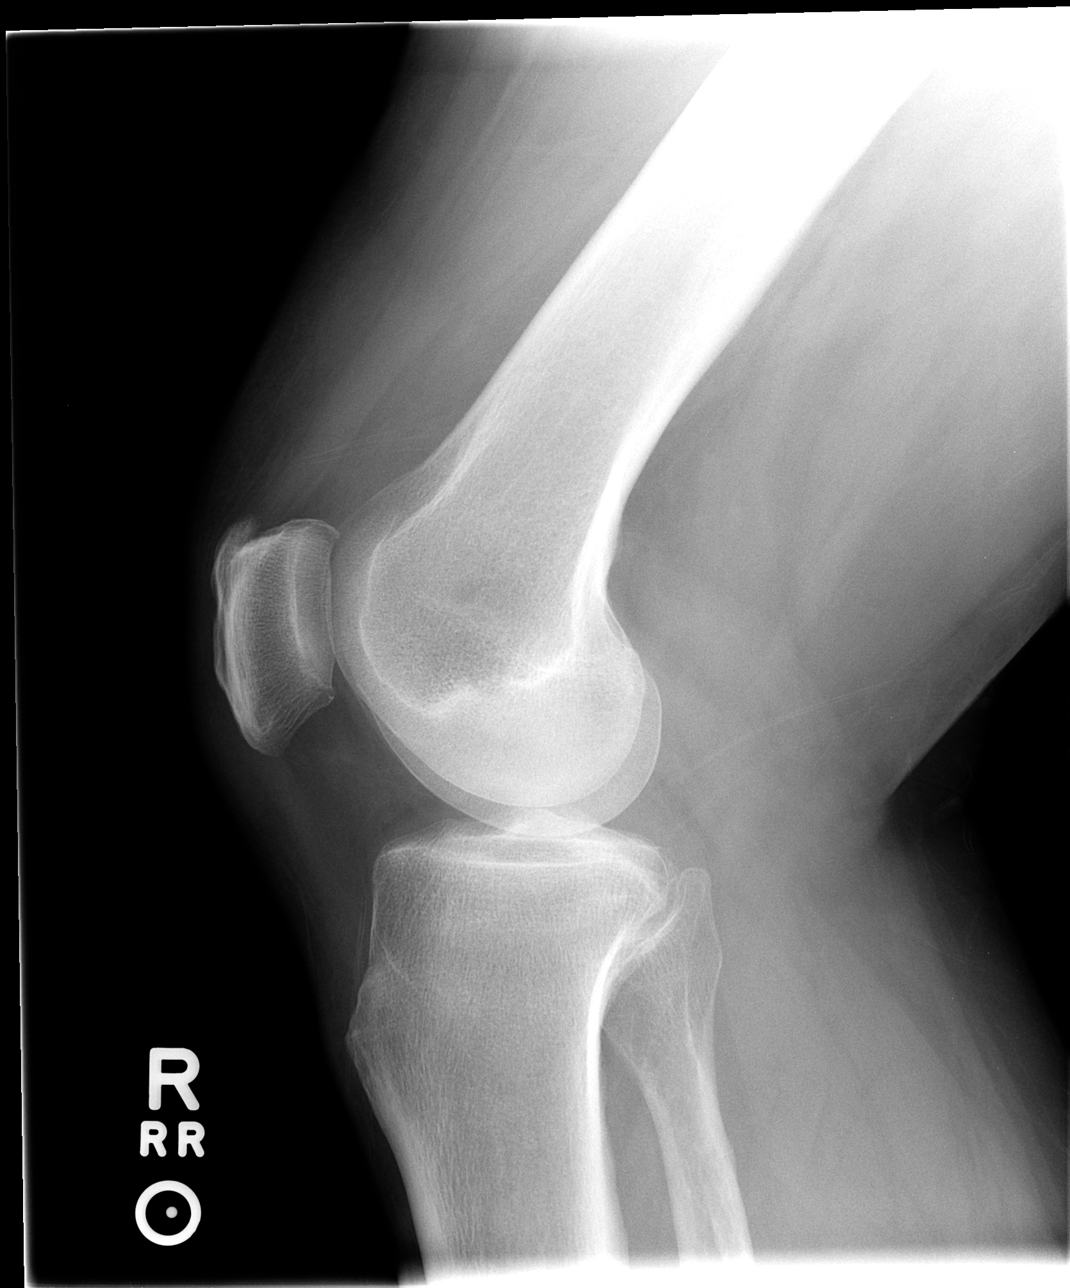

[4 of 4 positions shown; findings below may reference images not displayed]

FINDINGS: Four views of right knee submitted. No acute fracture or
subluxation. Narrowing of patellofemoral joint space. Mild spurring
of patella.
IMPRESSION: No acute fracture or subluxation. Mild degenerative changes.

## 2015-09-12 DIAGNOSIS — R202 Paresthesia of skin: Secondary | ICD-10-CM | POA: Diagnosis not present

## 2015-09-12 DIAGNOSIS — M79605 Pain in left leg: Secondary | ICD-10-CM | POA: Diagnosis not present

## 2015-09-12 DIAGNOSIS — M79604 Pain in right leg: Secondary | ICD-10-CM | POA: Diagnosis not present

## 2015-09-18 DIAGNOSIS — M79605 Pain in left leg: Secondary | ICD-10-CM | POA: Diagnosis not present

## 2015-09-18 DIAGNOSIS — E538 Deficiency of other specified B group vitamins: Secondary | ICD-10-CM | POA: Diagnosis not present

## 2015-09-18 DIAGNOSIS — M79604 Pain in right leg: Secondary | ICD-10-CM | POA: Diagnosis not present

## 2015-09-29 ENCOUNTER — Ambulatory Visit (INDEPENDENT_AMBULATORY_CARE_PROVIDER_SITE_OTHER): Payer: Medicare Other | Admitting: Sports Medicine

## 2015-09-29 ENCOUNTER — Encounter: Payer: Self-pay | Admitting: Sports Medicine

## 2015-09-29 VITALS — BP 150/84 | HR 93 | Resp 18 | Wt 261.4 lb

## 2015-09-29 DIAGNOSIS — M170 Bilateral primary osteoarthritis of knee: Secondary | ICD-10-CM | POA: Diagnosis not present

## 2015-09-29 NOTE — Progress Notes (Signed)
  Subjective:    CC: Follow-up  HPI: Bilateral knee pain: History of knee osteoarthritis, known, fantastic 76-year-old response to Supartz injections, now having recurrence of pain. Moderate, persistent at the joint lines without radiation, no mechanical symptoms.  Past medical history, Surgical history, Family history not pertinant except as noted below, Social history, Allergies, and medications have been entered into the medical record, reviewed, and no changes needed.   Review of Systems: No fevers, chills, night sweats, weight loss, chest pain, or shortness of breath.   Objective:    General: Well Developed, well nourished, and in no acute distress.  Neuro: Alert and oriented x3, extra-ocular muscles intact, sensation grossly intact.  HEENT: Normocephalic, atraumatic, pupils equal round reactive to light, neck supple, no masses, no lymphadenopathy, thyroid nonpalpable.  Skin: Warm and dry, no rashes. Cardiac: Regular rate and rhythm, no murmurs rubs or gallops, no lower extremity edema.  Respiratory: Clear to auscultation bilaterally. Not using accessory muscles, speaking in full sentences.  Procedure: Real-time Ultrasound Guided Injection of left knee Device: GE Logiq E  Verbal informed consent obtained.  Time-out conducted.  Noted no overlying erythema, induration, or other signs of local infection.  Skin prepped in a sterile fashion.  Local anesthesia: Topical Ethyl chloride.  With sterile technique and under real time ultrasound guidance:  1 mL kenalog 40, 2 mL lidocaine, 2 mL Marcaine injected easily, syringe switched and 30 mg/2 mL of OrthoVisc (sodium hyaluronate) in a prefilled syringe was injected easily into the knee through a 22-gauge needle. Completed without difficulty  Pain immediately resolved suggesting accurate placement of the medication.  Advised to call if fevers/chills, erythema, induration, drainage, or persistent bleeding.  Images permanently stored and  available for review in the ultrasound unit.  Impression: Technically successful ultrasound guided injection.  Procedure: Real-time Ultrasound Guided Injection of right knee Device: GE Logiq E  Verbal informed consent obtained.  Time-out conducted.  Noted no overlying erythema, induration, or other signs of local infection.  Skin prepped in a sterile fashion.  Local anesthesia: Topical Ethyl chloride.  With sterile technique and under real time ultrasound guidance:  1 mL kenalog 40, 2 mL lidocaine, 2 mL Marcaine injected easily, syringe switched and 30 mg/2 mL of OrthoVisc (sodium hyaluronate) in a prefilled syringe was injected easily into the knee through a 22-gauge needle. Completed without difficulty  Pain immediately resolved suggesting accurate placement of the medication.  Advised to call if fevers/chills, erythema, induration, drainage, or persistent bleeding.  Images permanently stored and available for review in the ultrasound unit.  Impression: Technically successful ultrasound guided injection.  Impression and Recommendations:

## 2015-09-29 NOTE — Assessment & Plan Note (Addendum)
Fantastic 75-year-old response to Supartz injection previously. We are going to treat both knees, steroid injection and Orthovisc No. 1, return in one week for Orthovisc injection #2 into both knees. He did have some questions about knee arthroscopy however I don't think that this will be needed as long as he continues to do well with interventional treatment.

## 2015-10-06 ENCOUNTER — Ambulatory Visit (INDEPENDENT_AMBULATORY_CARE_PROVIDER_SITE_OTHER): Payer: Medicare Other | Admitting: Sports Medicine

## 2015-10-06 VITALS — BP 159/78 | HR 83 | Resp 16 | Wt 254.7 lb

## 2015-10-06 DIAGNOSIS — M170 Bilateral primary osteoarthritis of knee: Secondary | ICD-10-CM

## 2015-10-06 NOTE — Assessment & Plan Note (Signed)
Orthovisc injection #2 into both knees, return in one week for #3, currently pain-free.

## 2015-10-06 NOTE — Progress Notes (Signed)

## 2015-10-10 ENCOUNTER — Ambulatory Visit (INDEPENDENT_AMBULATORY_CARE_PROVIDER_SITE_OTHER): Payer: Medicare Other | Admitting: Sports Medicine

## 2015-10-10 ENCOUNTER — Encounter: Payer: Self-pay | Admitting: Sports Medicine

## 2015-10-10 VITALS — BP 147/69 | HR 85 | Resp 18 | Wt 258.9 lb

## 2015-10-10 DIAGNOSIS — M170 Bilateral primary osteoarthritis of knee: Secondary | ICD-10-CM | POA: Diagnosis not present

## 2015-10-10 MED ORDER — MELOXICAM 15 MG PO TABS: 15.0000 mg | ORAL_TABLET | Freq: Every day | ORAL | Status: AC

## 2015-10-10 NOTE — Progress Notes (Signed)

## 2015-10-10 NOTE — Assessment & Plan Note (Signed)
Aspiration of the left knee, and bilateral Orthovisc injection #3. Return in one week for #4.

## 2015-10-13 ENCOUNTER — Ambulatory Visit: Payer: Medicare Other | Admitting: Sports Medicine

## 2015-10-17 ENCOUNTER — Encounter: Payer: Self-pay | Admitting: Sports Medicine

## 2015-10-17 ENCOUNTER — Ambulatory Visit (INDEPENDENT_AMBULATORY_CARE_PROVIDER_SITE_OTHER): Payer: Medicare Other | Admitting: Sports Medicine

## 2015-10-17 VITALS — BP 150/74 | HR 68 | Resp 18 | Wt 261.1 lb

## 2015-10-17 DIAGNOSIS — M170 Bilateral primary osteoarthritis of knee: Secondary | ICD-10-CM

## 2015-10-17 NOTE — Assessment & Plan Note (Signed)
Orthovisc injection #4 of 4 into both knees, essentially pain-free. Return as needed.

## 2015-10-17 NOTE — Progress Notes (Signed)
   Procedure: Real-time Ultrasound Guided Injection of right knee Device: GE Logiq E  Verbal informed consent obtained.  Time-out conducted.  Noted no overlying erythema, induration, or other signs of local infection.  Skin prepped in a sterile fashion.  Local anesthesia: Topical Ethyl chloride.  With sterile technique and under real time ultrasound guidance: 30 mg/2 mL of OrthoVisc (sodium hyaluronate) in a prefilled syringe was injected easily into the knee through a 22-gauge needle. Completed without difficulty  Pain immediately resolved suggesting accurate placement of the medication.  Advised to call if fevers/chills, erythema, induration, drainage, or persistent bleeding.  Images permanently stored and available for review in the ultrasound unit.  Impression: Technically successful ultrasound guided injection.  Procedure: Real-time Ultrasound Guided Injection of left knee Device: GE Logiq E  Verbal informed consent obtained.  Time-out conducted.  Noted no overlying erythema, induration, or other signs of local infection.  Skin prepped in a sterile fashion.  Local anesthesia: Topical Ethyl chloride.  With sterile technique and under real time ultrasound guidance: 30 mg/2 mL of OrthoVisc (sodium hyaluronate) in a prefilled syringe was injected easily into the knee through a 22-gauge needle. Completed without difficulty  Pain immediately resolved suggesting accurate placement of the medication.  Advised to call if fevers/chills, erythema, induration, drainage, or persistent bleeding.  Images permanently stored and available for review in the ultrasound unit.  Impression: Technically successful ultrasound guided injection.  

## 2015-10-24 DIAGNOSIS — Z1389 Encounter for screening for other disorder: Secondary | ICD-10-CM | POA: Diagnosis not present

## 2015-10-24 DIAGNOSIS — I4891 Unspecified atrial fibrillation: Secondary | ICD-10-CM | POA: Diagnosis not present

## 2015-10-24 DIAGNOSIS — F419 Anxiety disorder, unspecified: Secondary | ICD-10-CM | POA: Diagnosis not present

## 2015-10-24 DIAGNOSIS — I10 Essential (primary) hypertension: Secondary | ICD-10-CM | POA: Diagnosis not present

## 2015-10-24 DIAGNOSIS — E118 Type 2 diabetes mellitus with unspecified complications: Secondary | ICD-10-CM | POA: Diagnosis not present

## 2015-10-24 DIAGNOSIS — E7800 Pure hypercholesterolemia, unspecified: Secondary | ICD-10-CM | POA: Diagnosis not present

## 2015-10-24 DIAGNOSIS — Z0000 Encounter for general adult medical examination without abnormal findings: Secondary | ICD-10-CM | POA: Diagnosis not present

## 2015-10-24 DIAGNOSIS — M109 Gout, unspecified: Secondary | ICD-10-CM | POA: Diagnosis not present

## 2015-10-24 DIAGNOSIS — Z7984 Long term (current) use of oral hypoglycemic drugs: Secondary | ICD-10-CM | POA: Diagnosis not present

## 2015-10-24 DIAGNOSIS — K219 Gastro-esophageal reflux disease without esophagitis: Secondary | ICD-10-CM | POA: Diagnosis not present

## 2015-11-07 ENCOUNTER — Encounter: Payer: Self-pay | Admitting: Sports Medicine

## 2015-11-07 ENCOUNTER — Ambulatory Visit (INDEPENDENT_AMBULATORY_CARE_PROVIDER_SITE_OTHER): Payer: Medicare Other | Admitting: Sports Medicine

## 2015-11-07 VITALS — BP 151/77 | HR 80 | Resp 16 | Wt 260.0 lb

## 2015-11-07 DIAGNOSIS — M170 Bilateral primary osteoarthritis of knee: Secondary | ICD-10-CM

## 2015-11-07 NOTE — Assessment & Plan Note (Signed)
Right knee is doing well, left knee has had good improvements in pain after Orthovisc series, he does have an effusion which we will drain today. Return as needed. He can return as needed for effusions.

## 2015-11-07 NOTE — Progress Notes (Signed)
  Subjective:    CC: Follow-up  HPI: Knee osteoarthritis: Right knee is doing well, left knee also has really good improvement in pain but does have an effusion that he would like treated. Symptoms are mild, persistent. No mechanical symptoms. Significant improvement after Visco.  Past medical history, Surgical history, Family history not pertinant except as noted below, Social history, Allergies, and medications have been entered into the medical record, reviewed, and no changes needed.   Review of Systems: No fevers, chills, night sweats, weight loss, chest pain, or shortness of breath.   Objective:    General: Well Developed, well nourished, and in no acute distress.  Neuro: Alert and oriented x3, extra-ocular muscles intact, sensation grossly intact.  HEENT: Normocephalic, atraumatic, pupils equal round reactive to light, neck supple, no masses, no lymphadenopathy, thyroid nonpalpable.  Skin: Warm and dry, no rashes. Cardiac: Regular rate and rhythm, no murmurs rubs or gallops, no lower extremity edema.  Respiratory: Clear to auscultation bilaterally. Not using accessory muscles, speaking in full sentences. Left Knee: Visibly swollen with a palpable fluid wave and effusion Palpation normal with no warmth or joint line tenderness or patellar tenderness or condyle tenderness. ROM normal in flexion and extension and lower leg rotation. Ligaments with solid consistent endpoints including ACL, PCL, LCL, MCL. Negative Mcmurray's and provocative meniscal tests. Non painful patellar compression. Patellar and quadriceps tendons unremarkable. Hamstring and quadriceps strength is normal.  Procedure: Real-time Ultrasound Guided aspiration of left knee Device: GE Logiq E  Verbal informed consent obtained.  Time-out conducted.  Noted no overlying erythema, induration, or other signs of local infection.  Skin prepped in a sterile fashion.  Local anesthesia: Topical Ethyl chloride.  With  sterile technique and under real time ultrasound guidance:  20 mL straw-colored fluid aspirated through a 18-gauge needle Completed without difficulty  Pain immediately resolved suggesting accurate placement of the medication.  Advised to call if fevers/chills, erythema, induration, drainage, or persistent bleeding.  Images permanently stored and available for review in the ultrasound unit.  Impression: Technically successful ultrasound guided injection.  Impression and Recommendations:

## 2015-12-08 ENCOUNTER — Ambulatory Visit (INDEPENDENT_AMBULATORY_CARE_PROVIDER_SITE_OTHER): Payer: Medicare Other | Admitting: Sports Medicine

## 2015-12-08 ENCOUNTER — Encounter: Payer: Self-pay | Admitting: Sports Medicine

## 2015-12-08 VITALS — BP 132/74 | HR 89 | Resp 18 | Wt 259.3 lb

## 2015-12-08 DIAGNOSIS — M170 Bilateral primary osteoarthritis of knee: Secondary | ICD-10-CM

## 2015-12-08 NOTE — Progress Notes (Signed)
  Procedure: Real-time Ultrasound Guided aspiration of left knee Device: GE Logiq E  Verbal informed consent obtained.  Time-out conducted.  Noted no overlying erythema, induration, or other signs of local infection.  Skin prepped in a sterile fashion.  Local anesthesia: Topical Ethyl chloride.  With sterile technique and under real time ultrasound guidance:  Aspirated 20 mL of straw-colored fluid. Completed without difficulty  Pain immediately resolved suggesting accurate placement of the medication.  Advised to call if fevers/chills, erythema, induration, drainage, or persistent bleeding.  Images permanently stored and available for review in the ultrasound unit.  Impression: Technically successful ultrasound guided injection.

## 2015-12-08 NOTE — Assessment & Plan Note (Addendum)
Aspiration without injection, knee was strapped with compressive dressing, return as needed.

## 2016-01-19 DIAGNOSIS — R000 Tachycardia, unspecified: Secondary | ICD-10-CM | POA: Diagnosis not present

## 2016-01-21 DIAGNOSIS — Z7901 Long term (current) use of anticoagulants: Secondary | ICD-10-CM | POA: Diagnosis not present

## 2016-01-21 DIAGNOSIS — Z1211 Encounter for screening for malignant neoplasm of colon: Secondary | ICD-10-CM | POA: Diagnosis not present

## 2016-02-10 DIAGNOSIS — D123 Benign neoplasm of transverse colon: Secondary | ICD-10-CM | POA: Diagnosis not present

## 2016-02-10 DIAGNOSIS — K648 Other hemorrhoids: Secondary | ICD-10-CM | POA: Diagnosis not present

## 2016-02-10 DIAGNOSIS — Z1211 Encounter for screening for malignant neoplasm of colon: Secondary | ICD-10-CM | POA: Diagnosis not present

## 2016-02-10 DIAGNOSIS — D126 Benign neoplasm of colon, unspecified: Secondary | ICD-10-CM | POA: Diagnosis not present

## 2016-02-10 DIAGNOSIS — K621 Rectal polyp: Secondary | ICD-10-CM | POA: Diagnosis not present

## 2016-02-10 DIAGNOSIS — K635 Polyp of colon: Secondary | ICD-10-CM | POA: Diagnosis not present

## 2016-02-10 DIAGNOSIS — K5730 Diverticulosis of large intestine without perforation or abscess without bleeding: Secondary | ICD-10-CM | POA: Diagnosis not present

## 2016-02-13 DIAGNOSIS — Z23 Encounter for immunization: Secondary | ICD-10-CM | POA: Diagnosis not present

## 2016-02-17 ENCOUNTER — Ambulatory Visit (INDEPENDENT_AMBULATORY_CARE_PROVIDER_SITE_OTHER): Payer: Self-pay | Admitting: Cardiovascular Disease

## 2016-02-17 DIAGNOSIS — D126 Benign neoplasm of colon, unspecified: Secondary | ICD-10-CM | POA: Diagnosis not present

## 2016-02-17 DIAGNOSIS — Z1211 Encounter for screening for malignant neoplasm of colon: Secondary | ICD-10-CM | POA: Diagnosis not present

## 2016-02-17 DIAGNOSIS — K635 Polyp of colon: Secondary | ICD-10-CM | POA: Diagnosis not present

## 2016-02-17 DIAGNOSIS — I119 Hypertensive heart disease without heart failure: Secondary | ICD-10-CM | POA: Insufficient documentation

## 2016-02-17 DIAGNOSIS — R42 Dizziness and giddiness: Secondary | ICD-10-CM | POA: Insufficient documentation

## 2016-02-17 DIAGNOSIS — I7300 Raynaud's syndrome without gangrene: Secondary | ICD-10-CM | POA: Insufficient documentation

## 2016-02-17 DIAGNOSIS — R0602 Shortness of breath: Secondary | ICD-10-CM | POA: Insufficient documentation

## 2016-02-17 DIAGNOSIS — R55 Syncope and collapse: Secondary | ICD-10-CM | POA: Insufficient documentation

## 2016-02-20 ENCOUNTER — Ambulatory Visit (INDEPENDENT_AMBULATORY_CARE_PROVIDER_SITE_OTHER): Payer: Self-pay

## 2016-02-23 ENCOUNTER — Ambulatory Visit (INDEPENDENT_AMBULATORY_CARE_PROVIDER_SITE_OTHER): Payer: Self-pay

## 2016-02-24 ENCOUNTER — Ambulatory Visit (INDEPENDENT_AMBULATORY_CARE_PROVIDER_SITE_OTHER): Payer: Self-pay

## 2016-02-24 ENCOUNTER — Other Ambulatory Visit (INDEPENDENT_AMBULATORY_CARE_PROVIDER_SITE_OTHER): Payer: Self-pay

## 2016-02-24 DIAGNOSIS — X32XXXD Exposure to sunlight, subsequent encounter: Secondary | ICD-10-CM | POA: Diagnosis not present

## 2016-02-24 DIAGNOSIS — L570 Actinic keratosis: Secondary | ICD-10-CM | POA: Diagnosis not present

## 2016-02-24 DIAGNOSIS — Z1283 Encounter for screening for malignant neoplasm of skin: Secondary | ICD-10-CM | POA: Diagnosis not present

## 2016-02-24 DIAGNOSIS — Z85820 Personal history of malignant melanoma of skin: Secondary | ICD-10-CM | POA: Diagnosis not present

## 2016-02-24 DIAGNOSIS — Z08 Encounter for follow-up examination after completed treatment for malignant neoplasm: Secondary | ICD-10-CM | POA: Diagnosis not present

## 2016-03-08 ENCOUNTER — Ambulatory Visit (INDEPENDENT_AMBULATORY_CARE_PROVIDER_SITE_OTHER): Payer: Self-pay | Admitting: Cardiovascular Disease

## 2016-03-08 LAB — VAHRT HISTORIC LVEF: Ejection Fraction: 67 %

## 2016-05-05 DIAGNOSIS — M79605 Pain in left leg: Secondary | ICD-10-CM | POA: Diagnosis not present

## 2016-05-05 DIAGNOSIS — I10 Essential (primary) hypertension: Secondary | ICD-10-CM | POA: Diagnosis not present

## 2016-05-05 DIAGNOSIS — M79604 Pain in right leg: Secondary | ICD-10-CM | POA: Diagnosis not present

## 2016-05-05 DIAGNOSIS — I4891 Unspecified atrial fibrillation: Secondary | ICD-10-CM | POA: Diagnosis not present

## 2016-05-05 DIAGNOSIS — N529 Male erectile dysfunction, unspecified: Secondary | ICD-10-CM | POA: Diagnosis not present

## 2016-05-05 DIAGNOSIS — G4700 Insomnia, unspecified: Secondary | ICD-10-CM | POA: Diagnosis not present

## 2016-06-09 DIAGNOSIS — M170 Bilateral primary osteoarthritis of knee: Secondary | ICD-10-CM | POA: Diagnosis not present

## 2016-06-09 DIAGNOSIS — R52 Pain, unspecified: Secondary | ICD-10-CM | POA: Diagnosis not present

## 2016-06-29 DIAGNOSIS — L111 Transient acantholytic dermatosis [Grover]: Secondary | ICD-10-CM | POA: Diagnosis not present

## 2016-06-29 DIAGNOSIS — X32XXXD Exposure to sunlight, subsequent encounter: Secondary | ICD-10-CM | POA: Diagnosis not present

## 2016-06-29 DIAGNOSIS — I872 Venous insufficiency (chronic) (peripheral): Secondary | ICD-10-CM | POA: Diagnosis not present

## 2016-06-29 DIAGNOSIS — L570 Actinic keratosis: Secondary | ICD-10-CM | POA: Diagnosis not present

## 2016-06-29 DIAGNOSIS — L821 Other seborrheic keratosis: Secondary | ICD-10-CM | POA: Diagnosis not present

## 2016-09-10 DIAGNOSIS — F411 Generalized anxiety disorder: Secondary | ICD-10-CM | POA: Diagnosis not present

## 2016-09-10 DIAGNOSIS — E7800 Pure hypercholesterolemia, unspecified: Secondary | ICD-10-CM | POA: Diagnosis not present

## 2016-09-10 DIAGNOSIS — M79604 Pain in right leg: Secondary | ICD-10-CM | POA: Diagnosis not present

## 2016-09-10 DIAGNOSIS — M79605 Pain in left leg: Secondary | ICD-10-CM | POA: Diagnosis not present

## 2016-09-13 ENCOUNTER — Other Ambulatory Visit: Payer: Self-pay | Admitting: *Deleted

## 2016-09-13 DIAGNOSIS — H524 Presbyopia: Secondary | ICD-10-CM | POA: Diagnosis not present

## 2016-09-13 DIAGNOSIS — M79605 Pain in left leg: Secondary | ICD-10-CM

## 2016-09-16 ENCOUNTER — Encounter: Payer: Medicare Other | Admitting: Neurology

## 2016-09-21 ENCOUNTER — Ambulatory Visit (INDEPENDENT_AMBULATORY_CARE_PROVIDER_SITE_OTHER): Payer: Medicare Other | Admitting: Neurology

## 2016-09-21 DIAGNOSIS — M79605 Pain in left leg: Secondary | ICD-10-CM | POA: Diagnosis not present

## 2016-09-21 NOTE — Procedures (Signed)
[facility]  [address]  [place], [place] [address] Tel: [phone] Fax:  [phone] Test Date:  09/21/2016  Patient: Lance Davenport DOB: 10/13/1940 Physician: Narda Amber, DO  Sex: Male Height: 6'0" Ref Phys: Delanna Ahmadi, M.D.  ID#: [id number] Temp: 34.0C Technician:    Patient Complaints: This is a 76 year-old gentleman referred for evaluation of lower leg pain and paresthesias, worse on the left.  NCV & EMG Findings: Extensive electrodiagnostic testing of the right lower extremity and additional studies of the left shows:  1. Bilateral sural and superficial peroneal sensory responses are within normal limits. 2. Bilateral peroneal and tibial motor responses are within normal limits. 3. Bilateral tibial H reflex studies are symmetrically prolonged and most likely due to patient's height. 4. There is no evidence of active or chronic motor axon loss changes affecting any of the tested muscles. Of note, deep muscles were not tested as patient is on anticoagulation therapy.  Impression: This is a normal study. In particular, there is no evidence of a generalized sensorimotor polyneuropathy or lumbosacral radiculopathy affecting the lower extremities.   ___________________________ Narda Amber, DO    Nerve Conduction Studies Anti Sensory Summary Table   Site NR Peak (ms) Norm Peak (ms) P-T Amp (V) Norm P-T Amp  Left Sup Peroneal Anti Sensory (Ant Lat Mall)  34C  12 cm    3.0 <4.6 4.8 >3  Right Sup Peroneal Anti Sensory (Ant Lat Mall)  34C  12 cm    2.5 <4.6 5.7 >3  Left Sural Anti Sensory (Lat Mall)  34C  Calf    3.6 <4.6 6.6 >3  Right Sural Anti Sensory (Lat Mall)  34C  Calf    4.4 <4.6 6.0 >3   Motor Summary Table   Site NR Onset (ms) Norm Onset (ms) O-P Amp (mV) Norm O-P Amp Site1 Site2 Delta-0 (ms) Dist (cm) Vel (m/s) Norm Vel (m/s)  Left Peroneal Motor (Ext Dig Brev)  34C  Ankle    5.2 <6.0 3.0 >2.5 B Fib Ankle 8.9 39.0 44 >40    B Fib    14.1  3.2  Poplt B Fib 2.2 10.0 45 >40  Poplt    16.3  3.1         Right Peroneal Motor (Ext Dig Brev)  34C  Ankle    5.1 <6.0 2.5 >2.5 B Fib Ankle 9.4 38.0 40 >40  B Fib    14.5  2.0  Poplt B Fib 1.5 10.0 67 >40  Poplt    16.0  1.7         Left Tibial Motor (Abd Hall Brev)  34C  Ankle    5.4 <6.0 5.3 >4 Knee Ankle 9.7 44.0 45 >40  Knee    15.1  3.4         Right Tibial Motor (Abd Hall Brev)  34C  Ankle    4.7 <6.0 5.7 >4 Knee Ankle 10.7 46.0 43 >40  Knee    15.4  4.2          H Reflex Studies   NR H-Lat (ms) Lat Norm (ms) L-R H-Lat (ms)  Left Tibial (Gastroc)  34C     41.22 <35 0.00  Right Tibial (Gastroc)  34C     41.22 <35 0.00   EMG   Side Muscle Ins Act Fibs Psw Fasc Number Recrt Dur Dur. Amp Amp. Poly Poly. Comment  Right AntTibialis Nml Nml Nml Nml Nml Nml Nml Nml Nml  Nml Nml Nml N/A  Right Gastroc Nml Nml Nml Nml Nml Nml Nml Nml Nml Nml Nml Nml N/A  Right RectFemoris Nml Nml Nml Nml Nml Nml Nml Nml Nml Nml Nml Nml N/A  Right BicepsFemS Nml Nml Nml Nml Nml Nml Nml Nml Nml Nml Nml Nml N/A  Left AntTibialis Nml Nml Nml Nml Nml Nml Nml Nml Nml Nml Nml Nml N/A  Left Gastroc Nml Nml Nml Nml Nml Nml Nml Nml Nml Nml Nml Nml N/A  Left RectFemoris Nml Nml Nml Nml Nml Nml Nml Nml Nml Nml Nml Nml N/A  Left BicepsFemS Nml Nml Nml Nml Nml Nml Nml Nml Nml Nml Nml Nml N/A      Waveforms:

## 2016-10-01 ENCOUNTER — Ambulatory Visit (INDEPENDENT_AMBULATORY_CARE_PROVIDER_SITE_OTHER): Payer: Self-pay | Admitting: Cardiovascular Disease

## 2016-11-03 DIAGNOSIS — I10 Essential (primary) hypertension: Secondary | ICD-10-CM | POA: Diagnosis not present

## 2016-11-03 DIAGNOSIS — Z0000 Encounter for general adult medical examination without abnormal findings: Secondary | ICD-10-CM | POA: Diagnosis not present

## 2016-11-03 DIAGNOSIS — K219 Gastro-esophageal reflux disease without esophagitis: Secondary | ICD-10-CM | POA: Diagnosis not present

## 2016-11-03 DIAGNOSIS — Z1389 Encounter for screening for other disorder: Secondary | ICD-10-CM | POA: Diagnosis not present

## 2016-11-09 ENCOUNTER — Encounter: Payer: Self-pay | Admitting: Cardiology

## 2016-11-09 DIAGNOSIS — R079 Chest pain, unspecified: Secondary | ICD-10-CM | POA: Diagnosis not present

## 2016-11-09 DIAGNOSIS — I4891 Unspecified atrial fibrillation: Secondary | ICD-10-CM | POA: Diagnosis not present

## 2016-11-09 DIAGNOSIS — Z889 Allergy status to unspecified drugs, medicaments and biological substances status: Secondary | ICD-10-CM | POA: Diagnosis not present

## 2016-11-09 DIAGNOSIS — I10 Essential (primary) hypertension: Secondary | ICD-10-CM | POA: Diagnosis not present

## 2016-12-01 DIAGNOSIS — F411 Generalized anxiety disorder: Secondary | ICD-10-CM | POA: Diagnosis not present

## 2016-12-01 DIAGNOSIS — R000 Tachycardia, unspecified: Secondary | ICD-10-CM | POA: Diagnosis not present

## 2016-12-01 DIAGNOSIS — E7800 Pure hypercholesterolemia, unspecified: Secondary | ICD-10-CM | POA: Diagnosis not present

## 2016-12-23 ENCOUNTER — Ambulatory Visit: Payer: Medicare Other | Admitting: Cardiology

## 2016-12-29 DIAGNOSIS — R000 Tachycardia, unspecified: Secondary | ICD-10-CM | POA: Diagnosis not present

## 2016-12-29 DIAGNOSIS — R079 Chest pain, unspecified: Secondary | ICD-10-CM | POA: Diagnosis not present

## 2016-12-29 DIAGNOSIS — R9431 Abnormal electrocardiogram [ECG] [EKG]: Secondary | ICD-10-CM | POA: Diagnosis not present

## 2016-12-29 DIAGNOSIS — I480 Paroxysmal atrial fibrillation: Secondary | ICD-10-CM | POA: Diagnosis not present

## 2017-01-26 DIAGNOSIS — L821 Other seborrheic keratosis: Secondary | ICD-10-CM | POA: Diagnosis not present

## 2017-01-26 DIAGNOSIS — Z85820 Personal history of malignant melanoma of skin: Secondary | ICD-10-CM | POA: Diagnosis not present

## 2017-01-26 DIAGNOSIS — L570 Actinic keratosis: Secondary | ICD-10-CM | POA: Diagnosis not present

## 2017-01-26 DIAGNOSIS — Z08 Encounter for follow-up examination after completed treatment for malignant neoplasm: Secondary | ICD-10-CM | POA: Diagnosis not present

## 2017-02-15 DIAGNOSIS — Z23 Encounter for immunization: Secondary | ICD-10-CM | POA: Diagnosis not present

## 2017-03-18 DIAGNOSIS — M109 Gout, unspecified: Secondary | ICD-10-CM | POA: Diagnosis not present

## 2017-03-18 DIAGNOSIS — I4891 Unspecified atrial fibrillation: Secondary | ICD-10-CM | POA: Diagnosis not present

## 2017-03-18 DIAGNOSIS — I10 Essential (primary) hypertension: Secondary | ICD-10-CM | POA: Diagnosis not present

## 2017-03-18 DIAGNOSIS — K219 Gastro-esophageal reflux disease without esophagitis: Secondary | ICD-10-CM | POA: Diagnosis not present

## 2017-04-06 DIAGNOSIS — N410 Acute prostatitis: Secondary | ICD-10-CM | POA: Diagnosis not present

## 2017-05-20 DIAGNOSIS — M436 Torticollis: Secondary | ICD-10-CM | POA: Diagnosis not present

## 2017-05-20 DIAGNOSIS — M170 Bilateral primary osteoarthritis of knee: Secondary | ICD-10-CM | POA: Diagnosis not present

## 2017-06-21 DIAGNOSIS — M179 Osteoarthritis of knee, unspecified: Secondary | ICD-10-CM | POA: Diagnosis not present

## 2017-06-21 DIAGNOSIS — I480 Paroxysmal atrial fibrillation: Secondary | ICD-10-CM | POA: Diagnosis not present

## 2017-06-21 DIAGNOSIS — M1990 Unspecified osteoarthritis, unspecified site: Secondary | ICD-10-CM | POA: Diagnosis not present

## 2017-06-21 DIAGNOSIS — I10 Essential (primary) hypertension: Secondary | ICD-10-CM | POA: Diagnosis not present

## 2017-06-23 DIAGNOSIS — M79672 Pain in left foot: Secondary | ICD-10-CM | POA: Diagnosis not present

## 2017-06-23 DIAGNOSIS — M21622 Bunionette of left foot: Secondary | ICD-10-CM | POA: Diagnosis not present

## 2017-06-23 DIAGNOSIS — B079 Viral wart, unspecified: Secondary | ICD-10-CM | POA: Diagnosis not present

## 2017-07-01 DIAGNOSIS — I10 Essential (primary) hypertension: Secondary | ICD-10-CM | POA: Diagnosis not present

## 2017-07-01 DIAGNOSIS — M48061 Spinal stenosis, lumbar region without neurogenic claudication: Secondary | ICD-10-CM | POA: Diagnosis not present

## 2017-07-01 DIAGNOSIS — R52 Pain, unspecified: Secondary | ICD-10-CM | POA: Diagnosis not present

## 2017-07-07 DIAGNOSIS — M79671 Pain in right foot: Secondary | ICD-10-CM | POA: Diagnosis not present

## 2017-07-07 DIAGNOSIS — M79604 Pain in right leg: Secondary | ICD-10-CM | POA: Diagnosis not present

## 2017-07-07 DIAGNOSIS — M79605 Pain in left leg: Secondary | ICD-10-CM | POA: Diagnosis not present

## 2017-07-07 DIAGNOSIS — M79672 Pain in left foot: Secondary | ICD-10-CM | POA: Diagnosis not present

## 2017-07-27 DIAGNOSIS — M1990 Unspecified osteoarthritis, unspecified site: Secondary | ICD-10-CM | POA: Diagnosis not present

## 2017-07-27 DIAGNOSIS — M179 Osteoarthritis of knee, unspecified: Secondary | ICD-10-CM | POA: Diagnosis not present

## 2017-07-27 DIAGNOSIS — I10 Essential (primary) hypertension: Secondary | ICD-10-CM | POA: Diagnosis not present

## 2017-07-27 DIAGNOSIS — I480 Paroxysmal atrial fibrillation: Secondary | ICD-10-CM | POA: Diagnosis not present

## 2017-09-22 DIAGNOSIS — H524 Presbyopia: Secondary | ICD-10-CM | POA: Diagnosis not present

## 2017-09-22 DIAGNOSIS — H3530 Unspecified macular degeneration: Secondary | ICD-10-CM | POA: Diagnosis not present

## 2017-09-22 DIAGNOSIS — Z961 Presence of intraocular lens: Secondary | ICD-10-CM | POA: Diagnosis not present

## 2017-09-23 ENCOUNTER — Encounter (INDEPENDENT_AMBULATORY_CARE_PROVIDER_SITE_OTHER): Payer: Self-pay | Admitting: Ophthalmology

## 2017-09-23 NOTE — Progress Notes (Signed)
[facility] Note  09/26/2017     CHIEF COMPLAINT Patient presents for Retina Evaluation   HISTORY OF PRESENT ILLNESS: Lance Davenport is a 77 y.o. male who presents to the clinic today for:   HPI    Retina Evaluation    In both eyes.  This started 2 years ago.  Associated Symptoms Photophobia.  Negative for Blind Spot, Glare, Shoulder/Hip pain, Flashes, Pain, Trauma, Distortion, Jaw Claudication, Fatigue, Weight Loss, Scalp Tenderness, Redness, Floaters and Fever.  Context:  distance vision, mid-range vision, near vision and night driving.  Treatments tried include artificial tears.  Response to treatment was no improvement.  I, the attending physician,  performed the HPI with the patient and updated documentation appropriately.          Comments    Referral of Dr Gershon Crane for ARMD. Patient states he has had blurred vision for appx 1-2 yrs, right eye is worse than left, blinking helps focus sometimes, he also has light sensitivity especially at night (described as star burst effect )  also he is beginning to noticed sun light sensitivity. Denies floaters and ocular pain. Pt is using artifical tears PRN and taking multivitamins QD       Last edited by Bernarda Caffey, MD on 09/26/2017 12:35 PM. (History)    Pt states he saw Dr. Gershon Crane for routine exam; Pt states he has blurred VA OU off and on; Pt states blinking helps to clear OU VA; Pt states he was told that he "had some degeneration"; Pt states he had cataract removal OU, S/P lasik OU;   Referring physician: Rutherford Guys, MD [place], [place] [address]  HISTORICAL INFORMATION:   Selected notes from the MEDICAL RECORD NUMBER Referred by Dr. Dr. Gershon Crane for concern of ARMD OU LEE: 04.18.19 [name]) [BCVA: OD: 20/40 OS: 20/20] Ocular Hx- Pseudophakia OU, bilateral presbyopia, s/p RK OU PMH- Arthritis, HTN, DM (no DM meds on file/documented), SOB, Hx of melanoma, former smoker     CURRENT MEDICATIONS: No current outpatient medications on file. (Ophthalmic Drugs)   No current facility-administered medications for this visit.  (Ophthalmic Drugs)   Current Outpatient Medications (Other)  Medication Sig  . allopurinol (ZYLOPRIM) 100 MG tablet Take 100 mg by mouth daily.  . Cyanocobalamin (VITAMIN B 12 PO) Take by mouth.  . diltiazem (TIAZAC) 240 MG 24 hr capsule Take 240 mg by mouth daily.  . lansoprazole (PREVACID) 15 MG capsule Take 15 mg by mouth daily at 12 noon.  Marland Kitchen losartan (COZAAR) 100 MG tablet Take 100 mg by mouth daily.  . meloxicam (MOBIC) 15 MG tablet Take 1 tablet (15 mg total) by mouth daily.  . Multiple Vitamin (MULTIVITAMIN) tablet Take 1 tablet by mouth daily.  . rosuvastatin (CRESTOR) 5 MG tablet Take 5 mg by mouth daily. Reported on 09/29/2015  . sildenafil (VIAGRA) 100 MG tablet Take 100 mg by mouth daily as needed for erectile dysfunction.  Alveda Reasons 20 MG TABS tablet TK 1 T PO QD  . traZODone (DESYREL) 50 MG tablet Take 50 mg by mouth at bedtime.  . valsartan (DIOVAN) 160 MG tablet Take 160 mg by mouth daily.   No current facility-administered medications for this visit.  (Other)      REVIEW OF SYSTEMS: ROS    Positive for: Cardiovascular, Eyes   Negative for: Constitutional, Gastrointestinal, Neurological, Skin, Genitourinary, Musculoskeletal, HENT, Endocrine, Respiratory, Psychiatric, Allergic/Imm, Heme/Lymph   Last edited by Zenovia Jordan, LPN  on 09/26/2017 10:32 AM. (History)       ALLERGIES Allergies  Allergen Reactions  . Benazepril Hcl     cough  . Hctz [Hydrochlorothiazide] Rash    25mg      PAST MEDICAL HISTORY Past Medical History:  Diagnosis Date  . Allergic rhinitis   . Atrial fibrillation ([place]) 2014  . Diabetes mellitus without complication ([place])   . DJD (degenerative joint disease)   . ED (erectile dysfunction)   . GERD (gastroesophageal reflux disease)   . Gout   . Hyperlipidemia   . Hypertension   .  Low back pain   . Melanoma ([place]) 8/11   right calf   . Plantar fasciitis 2004  . SOB (shortness of breath)    Past Surgical History:  Procedure Laterality Date  . CATARACT EXTRACTION    . cataract surgery  9/13  . EYE SURGERY    . FRACTURE SURGERY     left leg with Pin  . LITHOTRIPSY    . melanoma surgery  8/11    FAMILY HISTORY Family History  Problem Relation Age of Onset  . Hypertension Mother   . Diabetes Mother   . Hypertension Father   . Asthma Sister   . Hypertension Sister   . Atrial fibrillation Sister     SOCIAL HISTORY Social History   Tobacco Use  . Smoking status: Former Research scientist (life sciences)  . Smokeless tobacco: Never Used  Substance Use Topics  . Alcohol use: Yes  . Drug use: No         OPHTHALMIC EXAM:  Base Eye Exam    Visual Acuity (Snellen - Linear)      Right Left   Dist [place] 20/30 - 20/20   Dist ph [place] 20/20 20/20       Tonometry (Tonopen, 10:25 AM)      Right Left   Pressure 15 16       Pupils      Dark Light Shape React APD   Right 3 2 Round Brisk None   Left 3 2 Round Brisk None       Visual Fields (Counting fingers)      Left Right    Full Full       Extraocular Movement      Right Left    Full, Ortho Full, Ortho       Neuro/Psych    Oriented x3:  Yes   Mood/Affect:  Normal       Dilation    Both eyes:  1.0% Mydriacyl, 2.5% Phenylephrine @ 10:25 AM        Slit Lamp and Fundus Exam    Slit Lamp Exam      Right Left   Lids/Lashes Dermatochalasis - upper lid Dermatochalasis - upper lid   Conjunctiva/Sclera Mild Nasal and temporal Pinguecula Mild Nasal and temporal Pinguecula   Cornea Arcus, Well healed cataract wounds, 4 RK scars at 1030, 0130, 0430, and 0730 Arcus, Well healed cataract wounds, 4 RK scars at 1030, 0130, 0430, and 0730   Anterior Chamber Deep and quiet Deep and quiet   Iris Round and dilated Mild iriodnesis , Round and dilated   Lens PC IOL in good position, 1+ Posterior capsular opacification PC IOL in  good position   Vitreous Vitreous syneresis, Posterior vitreous detachment Vitreous syneresis, Posterior vitreous detachment       Fundus Exam      Right Left   Disc Pink and Sharp Pink and Sharp   C/D Ratio  0.3 0.3   Macula Flat, Retinal pigment epithelial mottling, No heme or edema Flat, mild Retinal pigment epithelial mottling, mild Drusen, No heme or edema, trace Epiretinal membrane   Vessels Mild Vascular attenuation, mild AV crossing changes Mild Vascular attenuation, mild AV crossing changes   Periphery Attached, scattered RPE changes Attached, scattered RPE changes        Refraction    Manifest Refraction      Sphere Cylinder Axis Dist VA   Right [place] +0.75 150 20/20   Left +0.50 Sphere  20/20          IMAGING AND PROCEDURES  Imaging and Procedures for 09/27/17  OCT, Retina - OU - Both Eyes       Right Eye Quality was good. Central Foveal Thickness: 277. Progression has no prior data. Findings include no IRF, normal foveal contour, no SRF.   Left Eye Quality was good. Central Foveal Thickness: 312. Progression has no prior data. Findings include abnormal foveal contour, no IRF, no SRF, epiretinal membrane, retinal drusen .   Notes *Images captured and stored on drive  Diagnosis / Impression:  OD: NFP, No IRF/SRF  OS: mild ERM   Clinical management:  See below  Abbreviations: NFP - Normal foveal profile. CME - cystoid macular edema. PED - pigment epithelial detachment. IRF - intraretinal fluid. SRF - subretinal fluid. EZ - ellipsoid zone. ERM - epiretinal membrane. ORA - outer retinal atrophy. ORT - outer retinal tubulation. SRHM - subretinal hyper-reflective material                  ASSESSMENT/PLAN:    ICD-10-CM   1. Epiretinal membrane (ERM) of left eye H35.372   2. Posterior vitreous detachment of both eyes H43.813   3. Retinal edema H35.81 OCT, Retina - OU - Both Eyes  4. Pseudophakia of both eyes Z96.1   5. History of radial keratotomy  Z98.890     1. Epiretinal membrane, OS The natural history, anatomy, potential for loss of vision, and treatment options including vitrectomy techniques and the complications of endophthalmitis, retinal detachment, vitreous hemorrhage, cataract progression and permanent vision loss discussed with the patient. - mild ERM - no indication for surgery at this time - F/U 4-6 months  2. PVD / vitreous syneresis OU             Discussed findings and prognosis             No RT or RD on 360 scleral depressed exam             Reviewed s/s of RT/RD             Strict return precautions for any such RT/RD signs/symptoms  3. No retinal edema on exam or OCT  4. Pseudophakia OU             - s/p CE/IOL             - beautiful surgery, doing well             - monitor  5. S/P RK OU-              - by Dr. Gershon Crane             - doing well             - monitor  Ophthalmic Meds Ordered this visit:  No orders of the defined types were placed in this encounter.      Return in about  6 months (around 03/28/2018) for F/U ERM OS, DFE, OCT.  There are no Patient Instructions on file for this visit.   Explained the diagnoses, plan, and follow up with the patient and they expressed understanding.  Patient expressed understanding of the importance of proper follow up care.   This document serves as a record of services personally performed by Gardiner Sleeper, MD, PhD. It was created on their behalf by Ernest Mallick, OA, an ophthalmic assistant. The creation of this record is the provider's dictation and/or activities during the visit.    Electronically signed by: Ernest Mallick, OA  09/27/17 4:32 PM    Gardiner Sleeper, M.D., Ph.D. Diseases & Surgery of the Retina and [place] 09/27/17   I have reviewed the above documentation for accuracy and completeness, and I agree with the above. Gardiner Sleeper, M.D., Ph.D. 09/27/17 4:32 PM     Abbreviations: M  myopia (nearsighted); A astigmatism; H hyperopia (farsighted); P presbyopia; Mrx spectacle prescription;  CTL contact lenses; OD right eye; OS left eye; OU both eyes  XT exotropia; ET esotropia; PEK punctate epithelial keratitis; PEE punctate epithelial erosions; DES dry eye syndrome; MGD meibomian gland dysfunction; ATs artificial tears; PFAT's preservative free artificial tears; [place] nuclear sclerotic cataract; PSC posterior subcapsular cataract; ERM epi-retinal membrane; PVD posterior vitreous detachment; RD retinal detachment; DM diabetes mellitus; DR diabetic retinopathy; NPDR non-proliferative diabetic retinopathy; PDR proliferative diabetic retinopathy; CSME clinically significant macular edema; DME diabetic macular edema; dbh dot blot hemorrhages; CWS cotton wool spot; POAG primary open angle glaucoma; C/D cup-to-disc ratio; HVF humphrey visual field; GVF goldmann visual field; OCT optical coherence tomography; IOP intraocular pressure; BRVO Branch retinal vein occlusion; CRVO central retinal vein occlusion; CRAO central retinal artery occlusion; BRAO branch retinal artery occlusion; RT retinal tear; SB scleral buckle; PPV pars plana vitrectomy; VH Vitreous hemorrhage; PRP panretinal laser photocoagulation; IVK intravitreal kenalog; VMT vitreomacular traction; MH Macular hole;  NVD neovascularization of the disc; NVE neovascularization elsewhere; AREDS age related eye disease study; ARMD age related macular degeneration; POAG primary open angle glaucoma; EBMD epithelial/anterior basement membrane dystrophy; ACIOL anterior chamber intraocular lens; IOL intraocular lens; PCIOL posterior chamber intraocular lens; Phaco/IOL phacoemulsification with intraocular lens placement; [place] photorefractive keratectomy; LASIK laser assisted in situ keratomileusis; HTN hypertension; DM diabetes mellitus; COPD chronic obstructive pulmonary disease

## 2017-09-26 ENCOUNTER — Ambulatory Visit (INDEPENDENT_AMBULATORY_CARE_PROVIDER_SITE_OTHER): Payer: Medicare Other | Admitting: Ophthalmology

## 2017-09-26 ENCOUNTER — Encounter (INDEPENDENT_AMBULATORY_CARE_PROVIDER_SITE_OTHER): Payer: Self-pay | Admitting: Ophthalmology

## 2017-09-26 DIAGNOSIS — H35372 Puckering of macula, left eye: Secondary | ICD-10-CM | POA: Diagnosis not present

## 2017-09-26 DIAGNOSIS — Z961 Presence of intraocular lens: Secondary | ICD-10-CM

## 2017-09-26 DIAGNOSIS — Z9889 Other specified postprocedural states: Secondary | ICD-10-CM | POA: Diagnosis not present

## 2017-09-26 DIAGNOSIS — H3581 Retinal edema: Secondary | ICD-10-CM

## 2017-09-26 DIAGNOSIS — H43813 Vitreous degeneration, bilateral: Secondary | ICD-10-CM

## 2017-09-26 NOTE — Progress Notes (Signed)
Pt states he saw Dr. Gershon Crane for routine exam; Pt states he has blurred VA OU off and on; Pt states blinking helps to clear OU VA; Pt states he was told that he "had some degeneration"; Pt states he had cataract removal OU, S/P lasik OU;    This document serves as a record of services personally performed by Gardiner Sleeper, MD, PhD. It was created on their behalf by Catha Brow, [place], a certified ophthalmic assistant. The creation of this record is the provider's dictation and/or activities during the visit.  Electronically signed by: Catha Brow, [place]  09/26/17 11:21 AM   I have reviewed the above documentation for accuracy and completeness, and I agree with the above. Gardiner Sleeper, M.D., Ph.D. 09/27/17 4:29 PM

## 2017-09-27 ENCOUNTER — Encounter (INDEPENDENT_AMBULATORY_CARE_PROVIDER_SITE_OTHER): Payer: Self-pay | Admitting: Ophthalmology

## 2017-10-05 DIAGNOSIS — Z1283 Encounter for screening for malignant neoplasm of skin: Secondary | ICD-10-CM | POA: Diagnosis not present

## 2017-10-05 DIAGNOSIS — Z08 Encounter for follow-up examination after completed treatment for malignant neoplasm: Secondary | ICD-10-CM | POA: Diagnosis not present

## 2017-10-05 DIAGNOSIS — Z85820 Personal history of malignant melanoma of skin: Secondary | ICD-10-CM | POA: Diagnosis not present

## 2017-10-05 DIAGNOSIS — L821 Other seborrheic keratosis: Secondary | ICD-10-CM | POA: Diagnosis not present

## 2017-11-09 DIAGNOSIS — Z1389 Encounter for screening for other disorder: Secondary | ICD-10-CM | POA: Diagnosis not present

## 2017-11-09 DIAGNOSIS — I10 Essential (primary) hypertension: Secondary | ICD-10-CM | POA: Diagnosis not present

## 2017-11-09 DIAGNOSIS — Z0001 Encounter for general adult medical examination with abnormal findings: Secondary | ICD-10-CM | POA: Diagnosis not present

## 2017-11-09 DIAGNOSIS — M109 Gout, unspecified: Secondary | ICD-10-CM | POA: Diagnosis not present

## 2017-11-11 ENCOUNTER — Ambulatory Visit (INDEPENDENT_AMBULATORY_CARE_PROVIDER_SITE_OTHER): Payer: Self-pay | Admitting: Cardiovascular Disease

## 2017-12-07 DIAGNOSIS — X32XXXD Exposure to sunlight, subsequent encounter: Secondary | ICD-10-CM | POA: Diagnosis not present

## 2017-12-07 DIAGNOSIS — Z85828 Personal history of other malignant neoplasm of skin: Secondary | ICD-10-CM | POA: Diagnosis not present

## 2017-12-07 DIAGNOSIS — L570 Actinic keratosis: Secondary | ICD-10-CM | POA: Diagnosis not present

## 2017-12-07 DIAGNOSIS — Z08 Encounter for follow-up examination after completed treatment for malignant neoplasm: Secondary | ICD-10-CM | POA: Diagnosis not present

## 2018-01-03 DIAGNOSIS — I480 Paroxysmal atrial fibrillation: Secondary | ICD-10-CM | POA: Diagnosis not present

## 2018-01-23 DIAGNOSIS — L989 Disorder of the skin and subcutaneous tissue, unspecified: Secondary | ICD-10-CM | POA: Diagnosis not present

## 2018-02-28 DIAGNOSIS — H02823 Cysts of right eye, unspecified eyelid: Secondary | ICD-10-CM | POA: Diagnosis not present

## 2018-03-14 DIAGNOSIS — J0100 Acute maxillary sinusitis, unspecified: Secondary | ICD-10-CM | POA: Diagnosis not present

## 2018-03-14 DIAGNOSIS — R040 Epistaxis: Secondary | ICD-10-CM | POA: Diagnosis not present

## 2018-03-23 DIAGNOSIS — Z23 Encounter for immunization: Secondary | ICD-10-CM | POA: Diagnosis not present

## 2018-03-28 NOTE — Progress Notes (Signed)
[facility] Note  03/29/2018     CHIEF COMPLAINT Patient presents for Retina Follow Up   HISTORY OF PRESENT ILLNESS: Lance Davenport is a 77 y.o. male who presents to the clinic today for:   HPI    Retina Follow Up    Patient presents with  Other.  In left eye.  This started months ago.  Severity is mild.  Duration of months.  Since onset it is stable.  I, the attending physician,  performed the HPI with the patient and updated documentation appropriately.          Comments    77 y/o male pt here for 6 mo f/u for ERM OS.  No change in [place] OU.  OD occasionally gets blurry, but there is no consistent trigger or time for this.  Denies pain, flashes, floaters.  Uses Refresh gel gtts QHS OU and Soothe XP prn OU.       Last edited by Bernarda Caffey, MD on 03/29/2018 12:52 PM. (History)     Patient states no recent surgeries or procedures. Occasional blurry vision that is helped with blinking.   Referring physician: Lavone Orn, MD [place] [address] [employer] [address] [place], [place] [address]  HISTORICAL INFORMATION:   Selected notes from the MEDICAL RECORD NUMBER Referred by Dr. Dr. Gershon Crane for concern of ARMD OU LEE: 04.18.19 [name]) [BCVA: OD: 20/40 OS: 20/20] Ocular Hx- Pseudophakia OU, bilateral presbyopia, s/p RK OU PMH- Arthritis, HTN, DM (no DM meds on file/documented), SOB, Hx of melanoma, former smoker    CURRENT MEDICATIONS: No current outpatient medications on file. (Ophthalmic Drugs)   No current facility-administered medications for this visit.  (Ophthalmic Drugs)   Current Outpatient Medications (Other)  Medication Sig  . allopurinol (ZYLOPRIM) 100 MG tablet Take 100 mg by mouth daily.  . clonazePAM (KLONOPIN) 1 MG tablet TK  1/2 T   PO  ONCE A DAY IF NEEDED  . Cyanocobalamin (VITAMIN B 12 PO) Take by mouth.  . diltiazem (TIAZAC) 240 MG 24 hr capsule Take 240 mg by mouth daily.  Marland Kitchen gabapentin (NEURONTIN) 300 MG capsule TK 1 C PO BID   . lansoprazole (PREVACID) 15 MG capsule Take 15 mg by mouth daily at 12 noon.  Marland Kitchen losartan (COZAAR) 100 MG tablet Take 100 mg by mouth daily.  . meloxicam (MOBIC) 15 MG tablet Take 1 tablet (15 mg total) by mouth daily.  . Multiple Vitamin (MULTIVITAMIN) tablet Take 1 tablet by mouth daily.  . rosuvastatin (CRESTOR) 5 MG tablet Take 5 mg by mouth daily. Reported on 09/29/2015  . sildenafil (REVATIO) 20 MG tablet TK 2 TO 5 TS PO QD PRN  . sildenafil (VIAGRA) 100 MG tablet Take 100 mg by mouth daily as needed for erectile dysfunction.  . traZODone (DESYREL) 50 MG tablet Take 50 mg by mouth at bedtime.  . triamcinolone cream (KENALOG) 0.1 % APP EXT AA BID  . valsartan (DIOVAN) 160 MG tablet Take 160 mg by mouth daily.  Alveda Reasons 20 MG TABS tablet TK 1 T PO QD   No current facility-administered medications for this visit.  (Other)      REVIEW OF SYSTEMS: ROS    Positive for: Eyes   Negative for: Constitutional, Gastrointestinal, Neurological, Skin, Genitourinary, Musculoskeletal, HENT, Endocrine, Cardiovascular, Respiratory, Psychiatric, Allergic/Imm, Heme/Lymph   Last edited by Matthew Folks, COA on 03/29/2018 10:40 AM. (History)       ALLERGIES Allergies  Allergen Reactions  .  Benazepril Hcl     cough  . Hctz [Hydrochlorothiazide] Rash    25mg      PAST MEDICAL HISTORY Past Medical History:  Diagnosis Date  . Allergic rhinitis   . Atrial fibrillation ([place]) 2014  . Diabetes mellitus without complication ([place])   . DJD (degenerative joint disease)   . ED (erectile dysfunction)   . GERD (gastroesophageal reflux disease)   . Gout   . Hyperlipidemia   . Hypertension   . Low back pain   . Melanoma ([place]) 8/11   right calf   . Plantar fasciitis 2004  . SOB (shortness of breath)    Past Surgical History:  Procedure Laterality Date  . CATARACT EXTRACTION    . cataract surgery  9/13  . EYE SURGERY    . FRACTURE SURGERY     left leg with Pin  . LITHOTRIPSY    .  melanoma surgery  8/11    FAMILY HISTORY Family History  Problem Relation Age of Onset  . Hypertension Mother   . Diabetes Mother   . Hypertension Father   . Asthma Sister   . Hypertension Sister   . Atrial fibrillation Sister     SOCIAL HISTORY Social History   Tobacco Use  . Smoking status: Former Research scientist (life sciences)  . Smokeless tobacco: Never Used  Substance Use Topics  . Alcohol use: Yes  . Drug use: No         OPHTHALMIC EXAM:  Base Eye Exam    Visual Acuity (Snellen - Linear)      Right Left   Dist [place] 20/25 +2 20/20 -   Dist ph [place]        Tonometry (Tonopen, 10:52 AM)      Right Left   Pressure 13 14       Pupils      Dark Light Shape React APD   Right 3 2 Round Brisk None   Left 3 2 Round Brisk None       Visual Fields (Counting fingers)      Left Right    Full Full       Extraocular Movement      Right Left    Full, Ortho Full, Ortho       Neuro/Psych    Oriented x3:  Yes   Mood/Affect:  Normal       Dilation    Both eyes:  1.0% Mydriacyl, 2.5% Phenylephrine @ 10:52 AM        Slit Lamp and Fundus Exam    Slit Lamp Exam      Right Left   Lids/Lashes Dermatochalasis - upper lid Dermatochalasis - upper lid   Conjunctiva/Sclera Mild Nasal and temporal Pinguecula Mild Nasal and temporal Pinguecula   Cornea Arcus, Well healed cataract wounds, 4 RK scars at 1030, 0130, 0430, and 0730 Arcus, Well healed cataract wounds, 4 RK scars at 1030, 0130, 0430, and 0730, 2+ PEE   Anterior Chamber Deep and quiet Deep and quiet   Iris Round and dilated Mild iriodnesis , Round and dilated   Lens PC IOL in good position, 1+  Temporal Posterior capsular opacification PC IOL in good position   Vitreous Vitreous syneresis, Posterior vitreous detachment Vitreous syneresis, Posterior vitreous detachment       Fundus Exam      Right Left   Disc trace pallor Pink and Sharp   C/D Ratio 0.3, sharp rim 0.3   Macula Flat, ERM, blunted foveal reflex, , No heme  or edema  Flat, mild Retinal pigment epithelial mottling, ,No heme or edema, trace Epiretinal membrane, blunted foveal reflex, mild ERM   Vessels Mild Vascular attenuation, mild AV crossing changes Mild Vascular attenuation, mild AV crossing changes   Periphery Attached, scattered RPE changes Attached, scattered RPE changes          IMAGING AND PROCEDURES  Imaging and Procedures for 09/27/17  OCT, Retina - OU - Both Eyes       Right Eye Quality was good. Central Foveal Thickness: 317. Progression has worsened. Findings include no IRF, normal foveal contour, no SRF, epiretinal membrane (Interval development of ERM and change in foveal contour).   Left Eye Quality was good. Central Foveal Thickness: 318. Progression has been stable. Findings include abnormal foveal contour, no IRF, no SRF, epiretinal membrane, retinal drusen .   Notes *Images captured and stored on drive  Diagnosis / Impression:  OD: NFP, No IRF/SRF -- interval development of ERM OS: mild ERM   Clinical management:  See below  Abbreviations: NFP - Normal foveal profile. CME - cystoid macular edema. PED - pigment epithelial detachment. IRF - intraretinal fluid. SRF - subretinal fluid. EZ - ellipsoid zone. ERM - epiretinal membrane. ORA - outer retinal atrophy. ORT - outer retinal tubulation. SRHM - subretinal hyper-reflective material                  ASSESSMENT/PLAN:    ICD-10-CM   1. Epiretinal membrane (ERM) of both eyes H35.373   2. Posterior vitreous detachment of both eyes H43.813   3. Retinal edema H35.81 OCT, Retina - OU - Both Eyes  4. Pseudophakia of both eyes Z96.1   5. History of radial keratotomy Z98.890     1. Epiretinal membrane, OU - interval development of early ERM OD - ERM OS stable The natural history, anatomy, potential for loss of vision, and treatment options including vitrectomy techniques and the complications of endophthalmitis, retinal detachment, vitreous hemorrhage, cataract  progression and permanent vision loss discussed with the patient. - no metamorphopsia or blurred vision - no indication for surgery at this time - discussed s/s of ERM progression - F/U 6 months, sooner prn -- DFE/OCT  2. PVD / vitreous syneresis OU             Discussed findings and prognosis             No RT or RD on 360 peripheral exam             Reviewed s/s of RT/RD             Strict return precautions for any such RT/RD signs/symptoms  3. No retinal edema on exam or OCT  4. Pseudophakia OU             - s/p CE/IOL             - beautiful surgery, doing well             - monitor  5. S/P RK OU-              - by Dr. Gershon Crane             - doing well             - monitor  Ophthalmic Meds Ordered this visit:  No orders of the defined types were placed in this encounter.      Return in about 6 months (around 09/28/2018) for Dilated exam, ERM OS, OCT.  There are no Patient Instructions on file for this visit.   Explained the diagnoses, plan, and follow up with the patient and they expressed understanding.  Patient expressed understanding of the importance of proper follow up care.   This document serves as a record of services personally performed by Gardiner Sleeper, MD, PhD. It was created on their behalf by Ernest Mallick, OA, an ophthalmic assistant. The creation of this record is the provider's dictation and/or activities during the visit.    Electronically signed by: Ernest Mallick, OA  10.22.19 8:37 AM     Gardiner Sleeper, M.D., Ph.D. Diseases & Surgery of the Retina and Vitreous [facility]   I have reviewed the above documentation for accuracy and completeness, and I agree with the above. Gardiner Sleeper, M.D., Ph.D. 03/30/18 8:37 AM     Abbreviations: M myopia (nearsighted); A astigmatism; H hyperopia (farsighted); P presbyopia; Mrx spectacle prescription;  CTL contact lenses; OD right eye; OS left eye; OU both eyes  XT  exotropia; ET esotropia; PEK punctate epithelial keratitis; PEE punctate epithelial erosions; DES dry eye syndrome; MGD meibomian gland dysfunction; ATs artificial tears; PFAT's preservative free artificial tears; [place] nuclear sclerotic cataract; PSC posterior subcapsular cataract; ERM epi-retinal membrane; PVD posterior vitreous detachment; RD retinal detachment; DM diabetes mellitus; DR diabetic retinopathy; NPDR non-proliferative diabetic retinopathy; PDR proliferative diabetic retinopathy; CSME clinically significant macular edema; DME diabetic macular edema; dbh dot blot hemorrhages; CWS cotton wool spot; POAG primary open angle glaucoma; C/D cup-to-disc ratio; HVF humphrey visual field; GVF goldmann visual field; OCT optical coherence tomography; IOP intraocular pressure; BRVO Branch retinal vein occlusion; CRVO central retinal vein occlusion; CRAO central retinal artery occlusion; BRAO branch retinal artery occlusion; RT retinal tear; SB scleral buckle; PPV pars plana vitrectomy; VH Vitreous hemorrhage; PRP panretinal laser photocoagulation; IVK intravitreal kenalog; VMT vitreomacular traction; MH Macular hole;  NVD neovascularization of the disc; NVE neovascularization elsewhere; AREDS age related eye disease study; ARMD age related macular degeneration; POAG primary open angle glaucoma; EBMD epithelial/anterior basement membrane dystrophy; ACIOL anterior chamber intraocular lens; IOL intraocular lens; PCIOL posterior chamber intraocular lens; Phaco/IOL phacoemulsification with intraocular lens placement; [place] photorefractive keratectomy; LASIK laser assisted in situ keratomileusis; HTN hypertension; DM diabetes mellitus; COPD chronic obstructive pulmonary disease

## 2018-03-29 ENCOUNTER — Encounter (INDEPENDENT_AMBULATORY_CARE_PROVIDER_SITE_OTHER): Payer: Self-pay | Admitting: Ophthalmology

## 2018-03-29 ENCOUNTER — Ambulatory Visit (INDEPENDENT_AMBULATORY_CARE_PROVIDER_SITE_OTHER): Payer: Medicare Other | Admitting: Ophthalmology

## 2018-03-29 DIAGNOSIS — H43813 Vitreous degeneration, bilateral: Secondary | ICD-10-CM | POA: Diagnosis not present

## 2018-03-29 DIAGNOSIS — H35373 Puckering of macula, bilateral: Secondary | ICD-10-CM | POA: Diagnosis not present

## 2018-03-29 DIAGNOSIS — H3581 Retinal edema: Secondary | ICD-10-CM | POA: Diagnosis not present

## 2018-03-29 DIAGNOSIS — Z9889 Other specified postprocedural states: Secondary | ICD-10-CM

## 2018-03-29 DIAGNOSIS — Z961 Presence of intraocular lens: Secondary | ICD-10-CM

## 2018-03-30 ENCOUNTER — Encounter (INDEPENDENT_AMBULATORY_CARE_PROVIDER_SITE_OTHER): Payer: Self-pay | Admitting: Ophthalmology

## 2018-05-16 DIAGNOSIS — F419 Anxiety disorder, unspecified: Secondary | ICD-10-CM | POA: Diagnosis not present

## 2018-05-16 DIAGNOSIS — M4696 Unspecified inflammatory spondylopathy, lumbar region: Secondary | ICD-10-CM | POA: Diagnosis not present

## 2018-05-16 DIAGNOSIS — I10 Essential (primary) hypertension: Secondary | ICD-10-CM | POA: Diagnosis not present

## 2018-05-16 DIAGNOSIS — K219 Gastro-esophageal reflux disease without esophagitis: Secondary | ICD-10-CM | POA: Diagnosis not present

## 2018-06-14 DIAGNOSIS — L84 Corns and callosities: Secondary | ICD-10-CM | POA: Diagnosis not present

## 2018-06-14 DIAGNOSIS — L820 Inflamed seborrheic keratosis: Secondary | ICD-10-CM | POA: Diagnosis not present

## 2018-06-14 DIAGNOSIS — L570 Actinic keratosis: Secondary | ICD-10-CM | POA: Diagnosis not present

## 2018-06-14 DIAGNOSIS — X32XXXD Exposure to sunlight, subsequent encounter: Secondary | ICD-10-CM | POA: Diagnosis not present

## 2018-09-20 ENCOUNTER — Encounter (INDEPENDENT_AMBULATORY_CARE_PROVIDER_SITE_OTHER): Payer: Medicare Other | Admitting: Ophthalmology

## 2018-11-15 DIAGNOSIS — K219 Gastro-esophageal reflux disease without esophagitis: Secondary | ICD-10-CM | POA: Diagnosis not present

## 2018-11-15 DIAGNOSIS — F419 Anxiety disorder, unspecified: Secondary | ICD-10-CM | POA: Diagnosis not present

## 2018-11-15 DIAGNOSIS — M109 Gout, unspecified: Secondary | ICD-10-CM | POA: Diagnosis not present

## 2018-11-15 DIAGNOSIS — I10 Essential (primary) hypertension: Secondary | ICD-10-CM | POA: Diagnosis not present

## 2018-12-01 NOTE — Progress Notes (Signed)
[facility] Note  12/04/2018     CHIEF COMPLAINT Patient presents for Retina Follow Up   HISTORY OF PRESENT ILLNESS: Lance Davenport is a 78 y.o. male who presents to the clinic today for:   HPI    Retina Follow Up    Patient presents with  Other (ERM).  In both eyes.  Severity is moderate.  Duration of 9 months.  Since onset it is stable.  I, the attending physician,  performed the HPI with the patient and updated documentation appropriately.          Comments    Patient states vision the same OU.       Last edited by Bernarda Caffey, MD on 12/04/2018  1:53 PM. (History)    Patient states his vision seems to be blurrier than it was last time he was here, he states it seems hard to focus on things, he states it is hard to drive at night with the lights shining in his eyes and sunlight hurts them too, he states sunglasses do help   Referring physician: Lavone Orn, MD [address] [place],  [place] [address]  HISTORICAL INFORMATION:   Selected notes from the MEDICAL RECORD NUMBER Referred by Dr. Dr. Gershon Crane for concern of ARMD OU LEE: 04.18.19 [name]) [BCVA: OD: 20/40 OS: 20/20] Ocular Hx- Pseudophakia OU, bilateral presbyopia, s/p RK OU PMH- Arthritis, HTN, DM (no DM meds on file/documented), SOB, Hx of melanoma, former smoker    CURRENT MEDICATIONS: No current outpatient medications on file. (Ophthalmic Drugs)   No current facility-administered medications for this visit.  (Ophthalmic Drugs)   Current Outpatient Medications (Other)  Medication Sig  . allopurinol (ZYLOPRIM) 100 MG tablet Take 100 mg by mouth daily.  . Cyanocobalamin (VITAMIN B 12 PO) Take by mouth.  . diltiazem (TIAZAC) 240 MG 24 hr capsule Take 240 mg by mouth daily.  Marland Kitchen gabapentin (NEURONTIN) 300 MG capsule TK 1 C PO BID  . lansoprazole (PREVACID) 15 MG capsule Take 15 mg by mouth daily at 12 noon.  Marland Kitchen losartan (COZAAR) 100 MG tablet Take 50 mg by mouth  daily.   . Multiple Vitamin (MULTIVITAMIN) tablet Take 1 tablet by mouth daily.  . traZODone (DESYREL) 50 MG tablet Take 50 mg by mouth at bedtime.  . triamcinolone cream (KENALOG) 0.1 % APP EXT AA BID  . XARELTO 20 MG TABS tablet TK 1 T PO QD  . clonazePAM (KLONOPIN) 1 MG tablet TK  1/2 T   PO  ONCE A DAY IF NEEDED  . ezetimibe (ZETIA) 10 MG tablet Take 10 mg by mouth daily.  . meloxicam (MOBIC) 15 MG tablet Take 1 tablet (15 mg total) by mouth daily. (Patient not taking: Reported on 12/04/2018)  . rosuvastatin (CRESTOR) 5 MG tablet Take 5 mg by mouth daily. Reported on 09/29/2015  . sildenafil (REVATIO) 20 MG tablet TK 2 TO 5 TS PO QD PRN  . sildenafil (VIAGRA) 100 MG tablet Take 100 mg by mouth daily as needed for erectile dysfunction.  . valsartan (DIOVAN) 160 MG tablet Take 160 mg by mouth daily.   No current facility-administered medications for this visit.  (Other)      REVIEW OF SYSTEMS: ROS    Positive for: Eyes   Negative for: Constitutional, Gastrointestinal, Neurological, Skin, Genitourinary, Musculoskeletal, HENT, Endocrine, Cardiovascular, Respiratory, Psychiatric, Allergic/Imm, Heme/Lymph   Last edited by Roselee Nova D on 12/04/2018  1:01 PM. (History)  ALLERGIES Allergies  Allergen Reactions  . Benazepril Hcl     cough  . Hctz [Hydrochlorothiazide] Rash    25mg      PAST MEDICAL HISTORY Past Medical History:  Diagnosis Date  . Allergic rhinitis   . Atrial fibrillation ([place]) 2014  . Diabetes mellitus without complication ([place])   . DJD (degenerative joint disease)   . ED (erectile dysfunction)   . GERD (gastroesophageal reflux disease)   . Gout   . Hyperlipidemia   . Hypertension   . Low back pain   . Melanoma ([place]) 8/11   right calf   . Plantar fasciitis 2004  . SOB (shortness of breath)    Past Surgical History:  Procedure Laterality Date  . CATARACT EXTRACTION    . cataract surgery  9/13  . EYE SURGERY    . FRACTURE SURGERY     left leg  with Pin  . LITHOTRIPSY    . melanoma surgery  8/11    FAMILY HISTORY Family History  Problem Relation Age of Onset  . Hypertension Mother   . Diabetes Mother   . Hypertension Father   . Asthma Sister   . Hypertension Sister   . Atrial fibrillation Sister     SOCIAL HISTORY Social History   Tobacco Use  . Smoking status: Former Research scientist (life sciences)  . Smokeless tobacco: Never Used  Substance Use Topics  . Alcohol use: Yes  . Drug use: No         OPHTHALMIC EXAM:  Base Eye Exam    Visual Acuity (Snellen - Linear)      Right Left   Dist [place] 20/40 -1 20/20 -2   Dist ph [place] 20/20 -2        Tonometry (Tonopen, 1:15 PM)      Right Left   Pressure 17 19       Pupils      Dark Light Shape React APD   Right 3 2 Round Brisk None   Left 3 2 Round Brisk None       Visual Fields (Counting fingers)      Left Right    Full Full       Extraocular Movement      Right Left    Full, Ortho Full, Ortho       Neuro/Psych    Oriented x3: Yes   Mood/Affect: Normal       Dilation    Both eyes: 1.0% Mydriacyl, 2.5% Phenylephrine @ 1:15 PM        Slit Lamp and Fundus Exam    Slit Lamp Exam      Right Left   Lids/Lashes Dermatochalasis - upper lid Dermatochalasis - upper lid   Conjunctiva/Sclera Mild Nasal and temporal Pinguecula Mild Nasal and temporal Pinguecula   Cornea Arcus, Well healed cataract wounds, 4 RK scars at 1030, 0130, 0430, and 0730, 1+ Punctate epithelial erosions, mild tear film debris Arcus, Well healed cataract wounds, 4 RK scars at 1030, 0130, 0430, and 0730, 2+ PEE   Anterior Chamber Deep and quiet Deep and quiet   Iris Round and dilated Mild iriodnesis , Round and dilated   Lens PC IOL in good position, 1+  Temporal Posterior capsular opacification PC IOL in good position   Vitreous Vitreous syneresis, Posterior vitreous detachment Vitreous syneresis, Posterior vitreous detachment       Fundus Exam      Right Left   Disc trace pallor, sharp rim Pink and  Sharp   C/D  Ratio 0.3 0.3   Macula Flat, mild ERM, blunted foveal reflex, mild RPE mottling, No heme or edema Flat, Blunted foveal reflex, mild Retinal pigment epithelial mottling, mild Epiretinal membrane, No heme or edema   Vessels Mild Vascular attenuation, mild AV crossing changes Mild Vascular attenuation, mild AV crossing changes   Periphery Attached, scattered RPE changes Attached, scattered RPE changes          IMAGING AND PROCEDURES  Imaging and Procedures for 09/27/17  OCT, Retina - OU - Both Eyes       Right Eye Quality was good. Central Foveal Thickness: 302. Progression has worsened. Findings include no IRF, normal foveal contour, no SRF, epiretinal membrane (Mild Interval progression of ERM).   Left Eye Quality was good. Central Foveal Thickness: 309. Progression has been stable. Findings include abnormal foveal contour, no IRF, no SRF, epiretinal membrane, retinal drusen  (Stable ERM; mild progression of early pucker).   Notes *Images captured and stored on drive  Diagnosis / Impression:  OD: NFP, No IRF/SRF -- Mild Interval progression of ERM OS: Stable ERM; mild progression of early pucker  Clinical management:  See below  Abbreviations: NFP - Normal foveal profile. CME - cystoid macular edema. PED - pigment epithelial detachment. IRF - intraretinal fluid. SRF - subretinal fluid. EZ - ellipsoid zone. ERM - epiretinal membrane. ORA - outer retinal atrophy. ORT - outer retinal tubulation. SRHM - subretinal hyper-reflective material                  ASSESSMENT/PLAN:    ICD-10-CM   1. Epiretinal membrane (ERM) of both eyes  H35.373   2. Posterior vitreous detachment of both eyes  H43.813   3. Retinal edema  H35.81 OCT, Retina - OU - Both Eyes  4. Pseudophakia of both eyes  Z96.1   5. History of radial keratotomy  Z98.890   6. Epiretinal membrane (ERM) of left eye  H35.372     1. Epiretinal membrane, OU  - relatively stable OU  - BCVA 20/20 OU  -  no metamorphopsia or blurred vision  - no indication for surgery at this time  - discussed s/s of ERM progression  - F/U 6-9 months, sooner prn -- DFE/OCT  2. PVD / vitreous syneresis OU             - Discussed findings and prognosis             - No RT or RD on 360 peripheral exam             - Reviewed s/s of RT/RD             - Strict return precautions for any such RT/RD signs/symptoms  3. No retinal edema on exam or OCT  4. Pseudophakia OU             - s/p CE/IOL             - beautiful surgery, doing well             - monitor  5. S/P RK OU-              - by Dr. Gershon Crane             - doing well             - monitor  Ophthalmic Meds Ordered this visit:  No orders of the defined types were placed in this encounter.      Return for  f/u 6-9 months, ERM OU, DFE, OCT.  There are no Patient Instructions on file for this visit.   Explained the diagnoses, plan, and follow up with the patient and they expressed understanding.  Patient expressed understanding of the importance of proper follow up care.   This document serves as a record of services personally performed by Gardiner Sleeper, MD, PhD. It was created on their behalf by Ernest Mallick, OA, an ophthalmic assistant. The creation of this record is the provider's dictation and/or activities during the visit.    Electronically signed by: Ernest Mallick, OA  06.26.2020 9:27 PM    Gardiner Sleeper, M.D., Ph.D. Diseases & Surgery of the Retina and Vitreous [facility]  I have reviewed the above documentation for accuracy and completeness, and I agree with the above. Gardiner Sleeper, M.D., Ph.D. 12/05/18 9:27 PM    Abbreviations: M myopia (nearsighted); A astigmatism; H hyperopia (farsighted); P presbyopia; Mrx spectacle prescription;  CTL contact lenses; OD right eye; OS left eye; OU both eyes  XT exotropia; ET esotropia; PEK punctate epithelial keratitis; PEE punctate epithelial erosions; DES  dry eye syndrome; MGD meibomian gland dysfunction; ATs artificial tears; PFAT's preservative free artificial tears; [place] nuclear sclerotic cataract; PSC posterior subcapsular cataract; ERM epi-retinal membrane; PVD posterior vitreous detachment; RD retinal detachment; DM diabetes mellitus; DR diabetic retinopathy; NPDR non-proliferative diabetic retinopathy; PDR proliferative diabetic retinopathy; CSME clinically significant macular edema; DME diabetic macular edema; dbh dot blot hemorrhages; CWS cotton wool spot; POAG primary open angle glaucoma; C/D cup-to-disc ratio; HVF humphrey visual field; GVF goldmann visual field; OCT optical coherence tomography; IOP intraocular pressure; BRVO Branch retinal vein occlusion; CRVO central retinal vein occlusion; CRAO central retinal artery occlusion; BRAO branch retinal artery occlusion; RT retinal tear; SB scleral buckle; PPV pars plana vitrectomy; VH Vitreous hemorrhage; PRP panretinal laser photocoagulation; IVK intravitreal kenalog; VMT vitreomacular traction; MH Macular hole;  NVD neovascularization of the disc; NVE neovascularization elsewhere; AREDS age related eye disease study; ARMD age related macular degeneration; POAG primary open angle glaucoma; EBMD epithelial/anterior basement membrane dystrophy; ACIOL anterior chamber intraocular lens; IOL intraocular lens; PCIOL posterior chamber intraocular lens; Phaco/IOL phacoemulsification with intraocular lens placement; [place] photorefractive keratectomy; LASIK laser assisted in situ keratomileusis; HTN hypertension; DM diabetes mellitus; COPD chronic obstructive pulmonary disease

## 2018-12-04 ENCOUNTER — Ambulatory Visit (INDEPENDENT_AMBULATORY_CARE_PROVIDER_SITE_OTHER): Payer: Medicare Other | Admitting: Ophthalmology

## 2018-12-04 ENCOUNTER — Other Ambulatory Visit: Payer: Self-pay

## 2018-12-04 ENCOUNTER — Encounter (INDEPENDENT_AMBULATORY_CARE_PROVIDER_SITE_OTHER): Payer: Self-pay | Admitting: Ophthalmology

## 2018-12-04 DIAGNOSIS — H3581 Retinal edema: Secondary | ICD-10-CM | POA: Diagnosis not present

## 2018-12-04 DIAGNOSIS — H43813 Vitreous degeneration, bilateral: Secondary | ICD-10-CM

## 2018-12-04 DIAGNOSIS — H35372 Puckering of macula, left eye: Secondary | ICD-10-CM

## 2018-12-04 DIAGNOSIS — H35373 Puckering of macula, bilateral: Secondary | ICD-10-CM

## 2018-12-04 DIAGNOSIS — Z961 Presence of intraocular lens: Secondary | ICD-10-CM

## 2018-12-04 DIAGNOSIS — Z9889 Other specified postprocedural states: Secondary | ICD-10-CM

## 2018-12-29 DIAGNOSIS — L820 Inflamed seborrheic keratosis: Secondary | ICD-10-CM | POA: Diagnosis not present

## 2018-12-29 DIAGNOSIS — Z08 Encounter for follow-up examination after completed treatment for malignant neoplasm: Secondary | ICD-10-CM | POA: Diagnosis not present

## 2018-12-29 DIAGNOSIS — Z1283 Encounter for screening for malignant neoplasm of skin: Secondary | ICD-10-CM | POA: Diagnosis not present

## 2018-12-29 DIAGNOSIS — Z85820 Personal history of malignant melanoma of skin: Secondary | ICD-10-CM | POA: Diagnosis not present

## 2018-12-29 DIAGNOSIS — D225 Melanocytic nevi of trunk: Secondary | ICD-10-CM | POA: Diagnosis not present

## 2018-12-29 DIAGNOSIS — L739 Follicular disorder, unspecified: Secondary | ICD-10-CM | POA: Diagnosis not present

## 2019-01-22 DIAGNOSIS — E7800 Pure hypercholesterolemia, unspecified: Secondary | ICD-10-CM | POA: Diagnosis not present

## 2019-01-29 DIAGNOSIS — I480 Paroxysmal atrial fibrillation: Secondary | ICD-10-CM | POA: Diagnosis not present

## 2019-01-29 DIAGNOSIS — I10 Essential (primary) hypertension: Secondary | ICD-10-CM | POA: Diagnosis not present

## 2019-01-29 DIAGNOSIS — E785 Hyperlipidemia, unspecified: Secondary | ICD-10-CM | POA: Diagnosis not present

## 2019-01-30 DIAGNOSIS — Z85828 Personal history of other malignant neoplasm of skin: Secondary | ICD-10-CM | POA: Diagnosis not present

## 2019-01-30 DIAGNOSIS — L820 Inflamed seborrheic keratosis: Secondary | ICD-10-CM | POA: Diagnosis not present

## 2019-01-30 DIAGNOSIS — Z08 Encounter for follow-up examination after completed treatment for malignant neoplasm: Secondary | ICD-10-CM | POA: Diagnosis not present

## 2019-01-30 DIAGNOSIS — L570 Actinic keratosis: Secondary | ICD-10-CM | POA: Diagnosis not present

## 2019-02-15 DIAGNOSIS — Z23 Encounter for immunization: Secondary | ICD-10-CM | POA: Diagnosis not present

## 2019-02-20 DIAGNOSIS — L720 Epidermal cyst: Secondary | ICD-10-CM | POA: Diagnosis not present

## 2019-05-23 DIAGNOSIS — I10 Essential (primary) hypertension: Secondary | ICD-10-CM | POA: Diagnosis not present

## 2019-05-23 DIAGNOSIS — M109 Gout, unspecified: Secondary | ICD-10-CM | POA: Diagnosis not present

## 2019-05-23 DIAGNOSIS — K219 Gastro-esophageal reflux disease without esophagitis: Secondary | ICD-10-CM | POA: Diagnosis not present

## 2019-05-23 DIAGNOSIS — I480 Paroxysmal atrial fibrillation: Secondary | ICD-10-CM | POA: Diagnosis not present

## 2019-06-19 DIAGNOSIS — H04123 Dry eye syndrome of bilateral lacrimal glands: Secondary | ICD-10-CM | POA: Diagnosis not present

## 2019-06-19 DIAGNOSIS — R69 Illness, unspecified: Secondary | ICD-10-CM | POA: Diagnosis not present

## 2019-06-19 DIAGNOSIS — H3530 Unspecified macular degeneration: Secondary | ICD-10-CM | POA: Diagnosis not present

## 2019-06-19 DIAGNOSIS — H524 Presbyopia: Secondary | ICD-10-CM | POA: Diagnosis not present

## 2019-06-19 DIAGNOSIS — Z961 Presence of intraocular lens: Secondary | ICD-10-CM | POA: Diagnosis not present

## 2019-06-30 ENCOUNTER — Ambulatory Visit: Payer: Medicare Other | Attending: Internal Medicine

## 2019-06-30 DIAGNOSIS — Z23 Encounter for immunization: Secondary | ICD-10-CM | POA: Insufficient documentation

## 2019-06-30 NOTE — Progress Notes (Signed)
   Covid-19 Vaccination Clinic  Name:  ZADOK GROMAN    MRN: [id number] DOB: 03-30-1941  06/30/2019  Mr. Knoell was observed post Covid-19 immunization for 15 minutes without incidence. He was provided with Vaccine Information Sheet and instruction to access the V-Safe system.   Mr. Todhunter was instructed to call 911 with any severe reactions post vaccine: Marland Kitchen Difficulty breathing  . Swelling of your face and throat  . A fast heartbeat  . A bad rash all over your body  . Dizziness and weakness    Immunizations Administered    Name Date Dose VIS Date Route   Pfizer COVID-19 Vaccine 06/30/2019  1:50 PM 0.3 mL 05/18/2019 Intramuscular   Manufacturer: [place]   Lot: [id number]   [place]: [id number]

## 2019-07-16 NOTE — Progress Notes (Signed)
[facility] Note  07/30/2019     CHIEF COMPLAINT Patient presents for Retina Follow Up   HISTORY OF PRESENT ILLNESS: Lance Davenport is a 79 y.o. male who presents to the clinic today for:   HPI    Retina Follow Up    In both eyes.  Severity is mild.  Since onset it is stable.  I, the attending physician,  performed the HPI with the patient and updated documentation appropriately.          Comments    9 mos F/U ERM OU. Patient states he has occasional blurriness OU, denies new visual onsets         Last edited by Bernarda Caffey, MD on 07/30/2019  8:20 PM. (History)    Patient states he has blurred vision that "comes across" his eyes, he states he can blink and clear it up, but sometimes it lasts longer, he states he uses AT's and they help  Referring physician: Lavone Orn, MD [address] [place],  [place] [address]  HISTORICAL INFORMATION:   Selected notes from the MEDICAL RECORD NUMBER Referred by Dr. Dr. Gershon Crane for concern of ARMD OU LEE: 04.18.19 [name]) [BCVA: OD: 20/40 OS: 20/20] Ocular Hx- Pseudophakia OU, bilateral presbyopia, s/p RK OU PMH- Arthritis, HTN, DM (no DM meds on file/documented), SOB, Hx of melanoma, former smoker    CURRENT MEDICATIONS: No current outpatient medications on file. (Ophthalmic Drugs)   No current facility-administered medications for this visit. (Ophthalmic Drugs)   Current Outpatient Medications (Other)  Medication Sig  . allopurinol (ZYLOPRIM) 100 MG tablet Take 100 mg by mouth daily.  . clonazePAM (KLONOPIN) 1 MG tablet TK  1/2 T   PO  ONCE A DAY IF NEEDED  . Cyanocobalamin (VITAMIN B 12 PO) Take by mouth.  . diltiazem (TIAZAC) 240 MG 24 hr capsule Take 240 mg by mouth daily.  Marland Kitchen ezetimibe (ZETIA) 10 MG tablet Take 10 mg by mouth daily.  Marland Kitchen gabapentin (NEURONTIN) 300 MG capsule TK 1 C PO BID  . lansoprazole (PREVACID) 15 MG capsule Take 15 mg by mouth daily at 12 noon.  Marland Kitchen  losartan (COZAAR) 100 MG tablet Take 50 mg by mouth daily.   . meloxicam (MOBIC) 15 MG tablet Take 1 tablet (15 mg total) by mouth daily.  . Multiple Vitamin (MULTIVITAMIN) tablet Take 1 tablet by mouth daily.  . rosuvastatin (CRESTOR) 5 MG tablet Take 5 mg by mouth daily. Reported on 09/29/2015  . sildenafil (REVATIO) 20 MG tablet TK 2 TO 5 TS PO QD PRN  . sildenafil (VIAGRA) 100 MG tablet Take 100 mg by mouth daily as needed for erectile dysfunction.  . traZODone (DESYREL) 50 MG tablet Take 50 mg by mouth at bedtime.  . triamcinolone cream (KENALOG) 0.1 % APP EXT AA BID  . valsartan (DIOVAN) 160 MG tablet Take 160 mg by mouth daily.  Alveda Reasons 20 MG TABS tablet TK 1 T PO QD   No current facility-administered medications for this visit. (Other)      REVIEW OF SYSTEMS: ROS    Positive for: Eyes   Negative for: Constitutional, Gastrointestinal, Neurological, Skin, Genitourinary, Musculoskeletal, HENT, Endocrine, Cardiovascular, Respiratory, Psychiatric, Allergic/Imm, Heme/Lymph   Last edited by Bernarda Caffey, MD on 07/30/2019  8:21 PM. (History)       ALLERGIES Allergies  Allergen Reactions  . Benazepril Hcl     cough  . Hctz [Hydrochlorothiazide] Rash    25mg   PAST MEDICAL HISTORY Past Medical History:  Diagnosis Date  . Allergic rhinitis   . Atrial fibrillation ([place]) 2014  . Diabetes mellitus without complication ([place])   . DJD (degenerative joint disease)   . ED (erectile dysfunction)   . GERD (gastroesophageal reflux disease)   . Gout   . Hyperlipidemia   . Hypertension   . Low back pain   . Melanoma ([place]) 8/11   right calf   . Plantar fasciitis 2004  . SOB (shortness of breath)    Past Surgical History:  Procedure Laterality Date  . CATARACT EXTRACTION    . cataract surgery  9/13  . EYE SURGERY    . FRACTURE SURGERY     left leg with Pin  . LITHOTRIPSY    . melanoma surgery  8/11    FAMILY HISTORY Family History  Problem Relation Age of Onset  .  Hypertension Mother   . Diabetes Mother   . Hypertension Father   . Asthma Sister   . Hypertension Sister   . Atrial fibrillation Sister     SOCIAL HISTORY Social History   Tobacco Use  . Smoking status: Former Research scientist (life sciences)  . Smokeless tobacco: Never Used  Substance Use Topics  . Alcohol use: Yes  . Drug use: No         OPHTHALMIC EXAM:  Base Eye Exam    Visual Acuity (Snellen - Linear)      Right Left   Dist [place] 20/20 -1 20/20 -1   Dist ph [place] NI NI       Tonometry (Tonopen, 1:31 PM)      Right Left   Pressure 10 12       Pupils      Dark Light Shape React APD   Right 3 2 Round Brisk None   Left 3 2 Round Brisk None       Visual Fields (Counting fingers)      Left Right    Full Full       Extraocular Movement      Right Left    Full, Ortho Full, Ortho       Neuro/Psych    Oriented x3: Yes   Mood/Affect: Normal       Dilation    Both eyes: 1.0% Mydriacyl, 2.5% Phenylephrine @ 1:16 PM        Slit Lamp and Fundus Exam    Slit Lamp Exam      Right Left   Lids/Lashes Dermatochalasis - upper lid Dermatochalasis - upper lid   Conjunctiva/Sclera Mild Nasal and temporal Pinguecula Mild Nasal and temporal Pinguecula   Cornea Arcus, Well healed cataract wounds, 4 RK scars at 1030, 0130, 0430, and 0730, trace Punctate epithelial erosions, mild tear film debris Arcus, Well healed cataract wounds, 4 RK scars at 1030, 0130, 0430, and 0730, trace PEE, mild Debris in tear film   Anterior Chamber Deep and quiet Deep and quiet   Iris Round and dilated Mild iriodnesis , Round and dilated   Lens PC IOL in good position, 1+Temporal Posterior capsular opacification PC IOL in good position   Vitreous Vitreous syneresis, Posterior vitreous detachment, vitreous condensations Vitreous syneresis, Posterior vitreous detachment       Fundus Exam      Right Left   Disc trace pallor, sharp rim Pink and Sharp   C/D Ratio 0.3 0.3   Macula Flat, mild ERM, blunted foveal reflex,  mild RPE mottling, No heme or edema Flat, Blunted foveal reflex,  mild Retinal pigment epithelial mottling, mild Epiretinal membrane, No heme or edema   Vessels Mild Vascular attenuation, Tortuous Mild Vascular attenuation, mild AV crossing changes, mild Tortuousity   Periphery Attached, scattered RPE changes Attached, scattered RPE changes          IMAGING AND PROCEDURES  Imaging and Procedures for 09/27/17  OCT, Retina - OU - Both Eyes       Right Eye Quality was good. Central Foveal Thickness: 300. Progression has been stable. Findings include no IRF, normal foveal contour, no SRF, epiretinal membrane (Stable ERM).   Left Eye Quality was good. Central Foveal Thickness: 308. Progression has been stable. Findings include abnormal foveal contour, no IRF, no SRF, epiretinal membrane, retinal drusen  (Stable ERM and pucker).   Notes *Images captured and stored on drive  Diagnosis / Impression:  OD: NFP, No IRF/SRF -- stable ERM OS: Stable ERM with mild pucker; no IRF/SRF  Clinical management:  See below  Abbreviations: NFP - Normal foveal profile. CME - cystoid macular edema. PED - pigment epithelial detachment. IRF - intraretinal fluid. SRF - subretinal fluid. EZ - ellipsoid zone. ERM - epiretinal membrane. ORA - outer retinal atrophy. ORT - outer retinal tubulation. SRHM - subretinal hyper-reflective material                  ASSESSMENT/PLAN:    ICD-10-CM   1. Epiretinal membrane (ERM) of both eyes  H35.373   2. Posterior vitreous detachment of both eyes  H43.813   3. Retinal edema  H35.81 OCT, Retina - OU - Both Eyes  4. Pseudophakia of both eyes  Z96.1   5. History of radial keratotomy  Z98.890     1. Epiretinal membrane, OU  - relatively stable OU  - BCVA 20/20 OU  - no metamorphopsia or blurred vision  - no indication for surgery at this time  - discussed s/s of ERM progression  - F/U 1 year, sooner prn -- DFE/OCT  2. PVD / vitreous syneresis OU              - Discussed findings and prognosis             - No RT or RD on 360 peripheral exam             - Reviewed s/s of RT/RD             - Strict return precautions for any such RT/RD signs/symptoms  3. No retinal edema on exam or OCT  4. Pseudophakia OU             - s/p CE/IOL             - beautiful surgery, doing well             - monitor  5. S/P RK OU-              - by Dr. Gershon Crane             - doing well             - monitor  Ophthalmic Meds Ordered this visit:  No orders of the defined types were placed in this encounter.      Return in about 1 year (around 07/29/2020) for f/u ERM OU, DFE, OCT.  There are no Patient Instructions on file for this visit.   Explained the diagnoses, plan, and follow up with the patient and they expressed understanding.  Patient expressed understanding of  the importance of proper follow up care.   This document serves as a record of services personally performed by Gardiner Sleeper, MD, PhD. It was created on their behalf by Leeann Must, [place], a certified ophthalmic assistant. The creation of this record is the provider's dictation and/or activities during the visit.    Electronically signed by: Leeann Must, COA @TODAY @ 8:23 PM   This document serves as a record of services personally performed by Gardiner Sleeper, MD, PhD. It was created on their behalf by Ernest Mallick, OA, an ophthalmic assistant. The creation of this record is the provider's dictation and/or activities during the visit.    Electronically signed by: Ernest Mallick, OA 02.22.2021 8:23 PM   Gardiner Sleeper, M.D., Ph.D. Diseases & Surgery of the Retina and Vitreous [facility]  I have reviewed the above documentation for accuracy and completeness, and I agree with the above. Gardiner Sleeper, M.D., Ph.D. 07/30/19 8:23 PM   Abbreviations: M myopia (nearsighted); A astigmatism; H hyperopia (farsighted); P presbyopia; Mrx spectacle  prescription;  CTL contact lenses; OD right eye; OS left eye; OU both eyes  XT exotropia; ET esotropia; PEK punctate epithelial keratitis; PEE punctate epithelial erosions; DES dry eye syndrome; MGD meibomian gland dysfunction; ATs artificial tears; PFAT's preservative free artificial tears; [place] nuclear sclerotic cataract; PSC posterior subcapsular cataract; ERM epi-retinal membrane; PVD posterior vitreous detachment; RD retinal detachment; DM diabetes mellitus; DR diabetic retinopathy; NPDR non-proliferative diabetic retinopathy; PDR proliferative diabetic retinopathy; CSME clinically significant macular edema; DME diabetic macular edema; dbh dot blot hemorrhages; CWS cotton wool spot; POAG primary open angle glaucoma; C/D cup-to-disc ratio; HVF humphrey visual field; GVF goldmann visual field; OCT optical coherence tomography; IOP intraocular pressure; BRVO Branch retinal vein occlusion; CRVO central retinal vein occlusion; CRAO central retinal artery occlusion; BRAO branch retinal artery occlusion; RT retinal tear; SB scleral buckle; PPV pars plana vitrectomy; VH Vitreous hemorrhage; PRP panretinal laser photocoagulation; IVK intravitreal kenalog; VMT vitreomacular traction; MH Macular hole;  NVD neovascularization of the disc; NVE neovascularization elsewhere; AREDS age related eye disease study; ARMD age related macular degeneration; POAG primary open angle glaucoma; EBMD epithelial/anterior basement membrane dystrophy; ACIOL anterior chamber intraocular lens; IOL intraocular lens; PCIOL posterior chamber intraocular lens; Phaco/IOL phacoemulsification with intraocular lens placement; [place] photorefractive keratectomy; LASIK laser assisted in situ keratomileusis; HTN hypertension; DM diabetes mellitus; COPD chronic obstructive pulmonary disease

## 2019-07-21 ENCOUNTER — Ambulatory Visit: Payer: Medicare Other | Attending: Internal Medicine

## 2019-07-21 DIAGNOSIS — Z23 Encounter for immunization: Secondary | ICD-10-CM

## 2019-07-21 NOTE — Progress Notes (Signed)
   Covid-19 Vaccination Clinic  Name:  Lance Davenport    MRN: [id number] DOB: Jan 14, 1941  07/21/2019  Lance Davenport was observed post Covid-19 immunization for 15 minutes without incidence. He was provided with Vaccine Information Sheet and instruction to access the V-Safe system.   Lance Davenport was instructed to call 911 with any severe reactions post vaccine: Marland Kitchen Difficulty breathing  . Swelling of your face and throat  . A fast heartbeat  . A bad rash all over your body  . Dizziness and weakness    Immunizations Administered    Name Date Dose VIS Date Route   Pfizer COVID-19 Vaccine 07/21/2019 12:43 PM 0.3 mL 05/18/2019 Intramuscular   Manufacturer: [place]   Lot: [id number]   [place]: [id number]

## 2019-07-27 DIAGNOSIS — M545 Low back pain: Secondary | ICD-10-CM | POA: Diagnosis not present

## 2019-07-27 DIAGNOSIS — G8929 Other chronic pain: Secondary | ICD-10-CM | POA: Diagnosis not present

## 2019-07-30 ENCOUNTER — Other Ambulatory Visit: Payer: Self-pay

## 2019-07-30 ENCOUNTER — Ambulatory Visit (INDEPENDENT_AMBULATORY_CARE_PROVIDER_SITE_OTHER): Payer: Medicare Other | Admitting: Ophthalmology

## 2019-07-30 ENCOUNTER — Encounter (INDEPENDENT_AMBULATORY_CARE_PROVIDER_SITE_OTHER): Payer: Self-pay | Admitting: Ophthalmology

## 2019-07-30 DIAGNOSIS — Z961 Presence of intraocular lens: Secondary | ICD-10-CM

## 2019-07-30 DIAGNOSIS — H43813 Vitreous degeneration, bilateral: Secondary | ICD-10-CM

## 2019-07-30 DIAGNOSIS — H35373 Puckering of macula, bilateral: Secondary | ICD-10-CM | POA: Diagnosis not present

## 2019-07-30 DIAGNOSIS — Z9889 Other specified postprocedural states: Secondary | ICD-10-CM

## 2019-07-30 DIAGNOSIS — H3581 Retinal edema: Secondary | ICD-10-CM | POA: Diagnosis not present

## 2019-08-07 DIAGNOSIS — S336XXA Sprain of sacroiliac joint, initial encounter: Secondary | ICD-10-CM | POA: Diagnosis not present

## 2019-08-07 DIAGNOSIS — M9904 Segmental and somatic dysfunction of sacral region: Secondary | ICD-10-CM | POA: Diagnosis not present

## 2019-08-07 DIAGNOSIS — M5137 Other intervertebral disc degeneration, lumbosacral region: Secondary | ICD-10-CM | POA: Diagnosis not present

## 2019-08-07 DIAGNOSIS — M9903 Segmental and somatic dysfunction of lumbar region: Secondary | ICD-10-CM | POA: Diagnosis not present

## 2019-08-08 DIAGNOSIS — M9903 Segmental and somatic dysfunction of lumbar region: Secondary | ICD-10-CM | POA: Diagnosis not present

## 2019-08-08 DIAGNOSIS — S336XXA Sprain of sacroiliac joint, initial encounter: Secondary | ICD-10-CM | POA: Diagnosis not present

## 2019-08-08 DIAGNOSIS — M9904 Segmental and somatic dysfunction of sacral region: Secondary | ICD-10-CM | POA: Diagnosis not present

## 2019-08-08 DIAGNOSIS — M5137 Other intervertebral disc degeneration, lumbosacral region: Secondary | ICD-10-CM | POA: Diagnosis not present

## 2019-08-09 DIAGNOSIS — M9904 Segmental and somatic dysfunction of sacral region: Secondary | ICD-10-CM | POA: Diagnosis not present

## 2019-08-09 DIAGNOSIS — M9903 Segmental and somatic dysfunction of lumbar region: Secondary | ICD-10-CM | POA: Diagnosis not present

## 2019-08-09 DIAGNOSIS — S336XXA Sprain of sacroiliac joint, initial encounter: Secondary | ICD-10-CM | POA: Diagnosis not present

## 2019-08-09 DIAGNOSIS — M5137 Other intervertebral disc degeneration, lumbosacral region: Secondary | ICD-10-CM | POA: Diagnosis not present

## 2019-08-13 DIAGNOSIS — M9903 Segmental and somatic dysfunction of lumbar region: Secondary | ICD-10-CM | POA: Diagnosis not present

## 2019-08-13 DIAGNOSIS — M9904 Segmental and somatic dysfunction of sacral region: Secondary | ICD-10-CM | POA: Diagnosis not present

## 2019-08-13 DIAGNOSIS — M5137 Other intervertebral disc degeneration, lumbosacral region: Secondary | ICD-10-CM | POA: Diagnosis not present

## 2019-08-13 DIAGNOSIS — S336XXA Sprain of sacroiliac joint, initial encounter: Secondary | ICD-10-CM | POA: Diagnosis not present

## 2019-08-14 DIAGNOSIS — M9903 Segmental and somatic dysfunction of lumbar region: Secondary | ICD-10-CM | POA: Diagnosis not present

## 2019-08-14 DIAGNOSIS — M5137 Other intervertebral disc degeneration, lumbosacral region: Secondary | ICD-10-CM | POA: Diagnosis not present

## 2019-08-14 DIAGNOSIS — M9904 Segmental and somatic dysfunction of sacral region: Secondary | ICD-10-CM | POA: Diagnosis not present

## 2019-08-14 DIAGNOSIS — S336XXA Sprain of sacroiliac joint, initial encounter: Secondary | ICD-10-CM | POA: Diagnosis not present

## 2019-08-16 DIAGNOSIS — M9903 Segmental and somatic dysfunction of lumbar region: Secondary | ICD-10-CM | POA: Diagnosis not present

## 2019-08-16 DIAGNOSIS — M5137 Other intervertebral disc degeneration, lumbosacral region: Secondary | ICD-10-CM | POA: Diagnosis not present

## 2019-08-16 DIAGNOSIS — M9904 Segmental and somatic dysfunction of sacral region: Secondary | ICD-10-CM | POA: Diagnosis not present

## 2019-08-16 DIAGNOSIS — S336XXA Sprain of sacroiliac joint, initial encounter: Secondary | ICD-10-CM | POA: Diagnosis not present

## 2019-08-21 DIAGNOSIS — S336XXA Sprain of sacroiliac joint, initial encounter: Secondary | ICD-10-CM | POA: Diagnosis not present

## 2019-08-21 DIAGNOSIS — M9904 Segmental and somatic dysfunction of sacral region: Secondary | ICD-10-CM | POA: Diagnosis not present

## 2019-08-21 DIAGNOSIS — M9903 Segmental and somatic dysfunction of lumbar region: Secondary | ICD-10-CM | POA: Diagnosis not present

## 2019-08-21 DIAGNOSIS — M5137 Other intervertebral disc degeneration, lumbosacral region: Secondary | ICD-10-CM | POA: Diagnosis not present

## 2019-08-22 DIAGNOSIS — M9904 Segmental and somatic dysfunction of sacral region: Secondary | ICD-10-CM | POA: Diagnosis not present

## 2019-08-22 DIAGNOSIS — M9903 Segmental and somatic dysfunction of lumbar region: Secondary | ICD-10-CM | POA: Diagnosis not present

## 2019-08-22 DIAGNOSIS — S336XXA Sprain of sacroiliac joint, initial encounter: Secondary | ICD-10-CM | POA: Diagnosis not present

## 2019-08-22 DIAGNOSIS — M5137 Other intervertebral disc degeneration, lumbosacral region: Secondary | ICD-10-CM | POA: Diagnosis not present

## 2019-08-27 DIAGNOSIS — M5137 Other intervertebral disc degeneration, lumbosacral region: Secondary | ICD-10-CM | POA: Diagnosis not present

## 2019-08-27 DIAGNOSIS — S336XXA Sprain of sacroiliac joint, initial encounter: Secondary | ICD-10-CM | POA: Diagnosis not present

## 2019-08-27 DIAGNOSIS — M9904 Segmental and somatic dysfunction of sacral region: Secondary | ICD-10-CM | POA: Diagnosis not present

## 2019-08-27 DIAGNOSIS — M9903 Segmental and somatic dysfunction of lumbar region: Secondary | ICD-10-CM | POA: Diagnosis not present

## 2019-08-29 DIAGNOSIS — S336XXA Sprain of sacroiliac joint, initial encounter: Secondary | ICD-10-CM | POA: Diagnosis not present

## 2019-08-29 DIAGNOSIS — M5137 Other intervertebral disc degeneration, lumbosacral region: Secondary | ICD-10-CM | POA: Diagnosis not present

## 2019-08-29 DIAGNOSIS — M9904 Segmental and somatic dysfunction of sacral region: Secondary | ICD-10-CM | POA: Diagnosis not present

## 2019-08-29 DIAGNOSIS — M9903 Segmental and somatic dysfunction of lumbar region: Secondary | ICD-10-CM | POA: Diagnosis not present

## 2019-08-31 DIAGNOSIS — L820 Inflamed seborrheic keratosis: Secondary | ICD-10-CM | POA: Diagnosis not present

## 2019-08-31 DIAGNOSIS — L720 Epidermal cyst: Secondary | ICD-10-CM | POA: Diagnosis not present

## 2019-08-31 DIAGNOSIS — Z85820 Personal history of malignant melanoma of skin: Secondary | ICD-10-CM | POA: Diagnosis not present

## 2019-08-31 DIAGNOSIS — L111 Transient acantholytic dermatosis [Grover]: Secondary | ICD-10-CM | POA: Diagnosis not present

## 2019-09-03 DIAGNOSIS — M9904 Segmental and somatic dysfunction of sacral region: Secondary | ICD-10-CM | POA: Diagnosis not present

## 2019-09-03 DIAGNOSIS — S336XXA Sprain of sacroiliac joint, initial encounter: Secondary | ICD-10-CM | POA: Diagnosis not present

## 2019-09-03 DIAGNOSIS — M5137 Other intervertebral disc degeneration, lumbosacral region: Secondary | ICD-10-CM | POA: Diagnosis not present

## 2019-09-03 DIAGNOSIS — M9903 Segmental and somatic dysfunction of lumbar region: Secondary | ICD-10-CM | POA: Diagnosis not present

## 2019-09-04 DIAGNOSIS — S336XXA Sprain of sacroiliac joint, initial encounter: Secondary | ICD-10-CM | POA: Diagnosis not present

## 2019-09-04 DIAGNOSIS — M9904 Segmental and somatic dysfunction of sacral region: Secondary | ICD-10-CM | POA: Diagnosis not present

## 2019-09-04 DIAGNOSIS — M5137 Other intervertebral disc degeneration, lumbosacral region: Secondary | ICD-10-CM | POA: Diagnosis not present

## 2019-09-04 DIAGNOSIS — M9903 Segmental and somatic dysfunction of lumbar region: Secondary | ICD-10-CM | POA: Diagnosis not present

## 2019-09-06 DIAGNOSIS — M9904 Segmental and somatic dysfunction of sacral region: Secondary | ICD-10-CM | POA: Diagnosis not present

## 2019-09-06 DIAGNOSIS — M9903 Segmental and somatic dysfunction of lumbar region: Secondary | ICD-10-CM | POA: Diagnosis not present

## 2019-09-06 DIAGNOSIS — M5137 Other intervertebral disc degeneration, lumbosacral region: Secondary | ICD-10-CM | POA: Diagnosis not present

## 2019-09-06 DIAGNOSIS — S336XXA Sprain of sacroiliac joint, initial encounter: Secondary | ICD-10-CM | POA: Diagnosis not present

## 2019-09-11 DIAGNOSIS — S336XXA Sprain of sacroiliac joint, initial encounter: Secondary | ICD-10-CM | POA: Diagnosis not present

## 2019-09-11 DIAGNOSIS — M9903 Segmental and somatic dysfunction of lumbar region: Secondary | ICD-10-CM | POA: Diagnosis not present

## 2019-09-11 DIAGNOSIS — M5137 Other intervertebral disc degeneration, lumbosacral region: Secondary | ICD-10-CM | POA: Diagnosis not present

## 2019-09-11 DIAGNOSIS — M9904 Segmental and somatic dysfunction of sacral region: Secondary | ICD-10-CM | POA: Diagnosis not present

## 2019-09-18 DIAGNOSIS — M5137 Other intervertebral disc degeneration, lumbosacral region: Secondary | ICD-10-CM | POA: Diagnosis not present

## 2019-09-18 DIAGNOSIS — M9904 Segmental and somatic dysfunction of sacral region: Secondary | ICD-10-CM | POA: Diagnosis not present

## 2019-09-18 DIAGNOSIS — M9903 Segmental and somatic dysfunction of lumbar region: Secondary | ICD-10-CM | POA: Diagnosis not present

## 2019-09-18 DIAGNOSIS — S336XXA Sprain of sacroiliac joint, initial encounter: Secondary | ICD-10-CM | POA: Diagnosis not present

## 2019-09-20 DIAGNOSIS — M9903 Segmental and somatic dysfunction of lumbar region: Secondary | ICD-10-CM | POA: Diagnosis not present

## 2019-09-20 DIAGNOSIS — M9904 Segmental and somatic dysfunction of sacral region: Secondary | ICD-10-CM | POA: Diagnosis not present

## 2019-09-20 DIAGNOSIS — S336XXA Sprain of sacroiliac joint, initial encounter: Secondary | ICD-10-CM | POA: Diagnosis not present

## 2019-09-20 DIAGNOSIS — M5137 Other intervertebral disc degeneration, lumbosacral region: Secondary | ICD-10-CM | POA: Diagnosis not present

## 2019-10-23 DIAGNOSIS — B0089 Other herpesviral infection: Secondary | ICD-10-CM | POA: Diagnosis not present

## 2019-10-23 DIAGNOSIS — L258 Unspecified contact dermatitis due to other agents: Secondary | ICD-10-CM | POA: Diagnosis not present

## 2019-10-23 DIAGNOSIS — L720 Epidermal cyst: Secondary | ICD-10-CM | POA: Diagnosis not present

## 2019-11-09 DIAGNOSIS — L986 Other infiltrative disorders of the skin and subcutaneous tissue: Secondary | ICD-10-CM | POA: Diagnosis not present

## 2019-11-09 DIAGNOSIS — S30861A Insect bite (nonvenomous) of abdominal wall, initial encounter: Secondary | ICD-10-CM | POA: Diagnosis not present

## 2019-11-09 DIAGNOSIS — L720 Epidermal cyst: Secondary | ICD-10-CM | POA: Diagnosis not present

## 2019-11-21 DIAGNOSIS — Z0000 Encounter for general adult medical examination without abnormal findings: Secondary | ICD-10-CM | POA: Diagnosis not present

## 2019-11-21 DIAGNOSIS — M109 Gout, unspecified: Secondary | ICD-10-CM | POA: Diagnosis not present

## 2019-11-21 DIAGNOSIS — Z1389 Encounter for screening for other disorder: Secondary | ICD-10-CM | POA: Diagnosis not present

## 2019-11-21 DIAGNOSIS — K219 Gastro-esophageal reflux disease without esophagitis: Secondary | ICD-10-CM | POA: Diagnosis not present

## 2019-11-21 DIAGNOSIS — I10 Essential (primary) hypertension: Secondary | ICD-10-CM | POA: Diagnosis not present

## 2019-11-21 DIAGNOSIS — I480 Paroxysmal atrial fibrillation: Secondary | ICD-10-CM | POA: Diagnosis not present

## 2020-01-01 DIAGNOSIS — L0212 Furuncle of neck: Secondary | ICD-10-CM | POA: Diagnosis not present

## 2020-01-01 DIAGNOSIS — B9689 Other specified bacterial agents as the cause of diseases classified elsewhere: Secondary | ICD-10-CM | POA: Diagnosis not present

## 2020-01-01 DIAGNOSIS — L720 Epidermal cyst: Secondary | ICD-10-CM | POA: Diagnosis not present

## 2020-01-01 DIAGNOSIS — L080 Pyoderma: Secondary | ICD-10-CM | POA: Diagnosis not present

## 2020-01-10 ENCOUNTER — Encounter (INDEPENDENT_AMBULATORY_CARE_PROVIDER_SITE_OTHER): Payer: Self-pay

## 2020-01-29 DIAGNOSIS — Z08 Encounter for follow-up examination after completed treatment for malignant neoplasm: Secondary | ICD-10-CM | POA: Diagnosis not present

## 2020-01-29 DIAGNOSIS — D225 Melanocytic nevi of trunk: Secondary | ICD-10-CM | POA: Diagnosis not present

## 2020-01-29 DIAGNOSIS — Z85820 Personal history of malignant melanoma of skin: Secondary | ICD-10-CM | POA: Diagnosis not present

## 2020-01-29 DIAGNOSIS — Z1283 Encounter for screening for malignant neoplasm of skin: Secondary | ICD-10-CM | POA: Diagnosis not present

## 2020-01-29 DIAGNOSIS — L821 Other seborrheic keratosis: Secondary | ICD-10-CM | POA: Diagnosis not present

## 2020-02-01 DIAGNOSIS — I10 Essential (primary) hypertension: Secondary | ICD-10-CM | POA: Diagnosis not present

## 2020-02-01 DIAGNOSIS — I480 Paroxysmal atrial fibrillation: Secondary | ICD-10-CM | POA: Diagnosis not present

## 2020-02-01 DIAGNOSIS — E785 Hyperlipidemia, unspecified: Secondary | ICD-10-CM | POA: Diagnosis not present

## 2020-02-19 DIAGNOSIS — A499 Bacterial infection, unspecified: Secondary | ICD-10-CM | POA: Diagnosis not present

## 2020-02-20 ENCOUNTER — Encounter (INDEPENDENT_AMBULATORY_CARE_PROVIDER_SITE_OTHER): Payer: Self-pay

## 2020-02-21 ENCOUNTER — Encounter (INDEPENDENT_AMBULATORY_CARE_PROVIDER_SITE_OTHER): Payer: Self-pay | Admitting: Nurse Practitioner

## 2020-02-21 ENCOUNTER — Ambulatory Visit (INDEPENDENT_AMBULATORY_CARE_PROVIDER_SITE_OTHER): Payer: Medicare Other | Admitting: Nurse Practitioner

## 2020-02-21 VITALS — BP 132/80 | HR 72 | Ht 68.0 in | Wt 155.0 lb

## 2020-02-21 DIAGNOSIS — I119 Hypertensive heart disease without heart failure: Secondary | ICD-10-CM

## 2020-02-21 DIAGNOSIS — R42 Dizziness and giddiness: Secondary | ICD-10-CM
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
## 2020-02-26 DIAGNOSIS — E785 Hyperlipidemia, unspecified: Secondary | ICD-10-CM | POA: Diagnosis not present

## 2020-02-26 DIAGNOSIS — I480 Paroxysmal atrial fibrillation: Secondary | ICD-10-CM | POA: Diagnosis not present

## 2020-02-26 DIAGNOSIS — I10 Essential (primary) hypertension: Secondary | ICD-10-CM | POA: Diagnosis not present

## 2020-03-19 DIAGNOSIS — Z23 Encounter for immunization: Secondary | ICD-10-CM | POA: Diagnosis not present

## 2020-03-22 ENCOUNTER — Ambulatory Visit: Payer: Medicare Other | Attending: Internal Medicine

## 2020-03-22 DIAGNOSIS — Z23 Encounter for immunization: Secondary | ICD-10-CM

## 2020-03-22 NOTE — Progress Notes (Signed)
   Covid-19 Vaccination Clinic  Name:  Lance Davenport    MRN: [id number] DOB: Mar 17, 1941  03/22/2020  Lance Davenport was observed post Covid-19 immunization for 15 minutes without incident. He was provided with Vaccine Information Sheet and instruction to access the V-Safe system.   Lance Davenport was instructed to call 911 with any severe reactions post vaccine: Marland Kitchen Difficulty breathing  . Swelling of face and throat  . A fast heartbeat  . A bad rash all over body  . Dizziness and weakness

## 2020-05-26 ENCOUNTER — Ambulatory Visit (INDEPENDENT_AMBULATORY_CARE_PROVIDER_SITE_OTHER): Payer: Medicare Other | Admitting: Cardiovascular Disease

## 2020-05-26 DIAGNOSIS — R3915 Urgency of urination: Secondary | ICD-10-CM | POA: Diagnosis not present

## 2020-05-26 DIAGNOSIS — M2550 Pain in unspecified joint: Secondary | ICD-10-CM | POA: Diagnosis not present

## 2020-05-26 DIAGNOSIS — I10 Essential (primary) hypertension: Secondary | ICD-10-CM | POA: Diagnosis not present

## 2020-05-26 DIAGNOSIS — K219 Gastro-esophageal reflux disease without esophagitis: Secondary | ICD-10-CM | POA: Diagnosis not present

## 2020-06-12 DIAGNOSIS — M170 Bilateral primary osteoarthritis of knee: Secondary | ICD-10-CM | POA: Diagnosis not present

## 2020-06-12 DIAGNOSIS — M25561 Pain in right knee: Secondary | ICD-10-CM | POA: Diagnosis not present

## 2020-06-12 DIAGNOSIS — M25562 Pain in left knee: Secondary | ICD-10-CM | POA: Diagnosis not present

## 2020-06-12 DIAGNOSIS — G8929 Other chronic pain: Secondary | ICD-10-CM | POA: Diagnosis not present

## 2020-07-23 NOTE — Progress Notes (Signed)
[facility] Note  07/29/2020     CHIEF COMPLAINT Patient presents for Retina Follow Up   HISTORY OF PRESENT ILLNESS: Lance Davenport is a 80 y.o. male who presents to the clinic today for:  HPI    Retina Follow Up    Patient presents with  Other.  In both eyes.  This started 1 year ago.  I, the attending physician,  performed the HPI with the patient and updated documentation appropriately.          Comments    Patient here for 1 year retina follow up for ERM OU. Patient states vision not good. Has blurred spots that come on eyes. Wants checked good.  Like water spots come across eyes. Has used gtts for dry eyes.  Didn't help. No eye pain.        Last edited by Bernarda Caffey, MD on 07/29/2020  4:43 PM. (History)    Pt states vision is stable, but he has "blurred spots" that come across his vision, he states if he moves his eye or focuses on something new, they will go away, he is using Refresh once to twice a day, he states if he uses them more than that, they seem to leave a film on his vision  Referring physician: Lavone Orn, MD [address] [place],  [place] [address]  HISTORICAL INFORMATION:   Selected notes from the MEDICAL RECORD NUMBER Referred by Dr. Dr. Gershon Crane for concern of ARMD OU LEE: 04.18.19 [name]) [BCVA: OD: 20/40 OS: 20/20] Ocular Hx- Pseudophakia OU, bilateral presbyopia, s/p RK OU PMH- Arthritis, HTN, DM (no DM meds on file/documented), SOB, Hx of melanoma, former smoker   CURRENT MEDICATIONS: No current outpatient medications on file. (Ophthalmic Drugs)   No current facility-administered medications for this visit. (Ophthalmic Drugs)   Current Outpatient Medications (Other)  Medication Sig  . allopurinol (ZYLOPRIM) 100 MG tablet Take 100 mg by mouth daily.  . clonazePAM (KLONOPIN) 1 MG tablet TK  1/2 T   PO  ONCE A DAY IF NEEDED  . Cyanocobalamin (VITAMIN B 12 PO) Take by mouth.  . diltiazem (TIAZAC)  240 MG 24 hr capsule Take 240 mg by mouth daily.  Marland Kitchen ezetimibe (ZETIA) 10 MG tablet Take 10 mg by mouth daily.  Marland Kitchen gabapentin (NEURONTIN) 300 MG capsule TK 1 C PO BID  . lansoprazole (PREVACID) 15 MG capsule Take 15 mg by mouth daily at 12 noon.  Marland Kitchen losartan (COZAAR) 100 MG tablet Take 50 mg by mouth daily.   . meloxicam (MOBIC) 15 MG tablet Take 1 tablet (15 mg total) by mouth daily.  . Multiple Vitamin (MULTIVITAMIN) tablet Take 1 tablet by mouth daily.  . rosuvastatin (CRESTOR) 5 MG tablet Take 5 mg by mouth daily. Reported on 09/29/2015  . sildenafil (REVATIO) 20 MG tablet TK 2 TO 5 TS PO QD PRN  . sildenafil (VIAGRA) 100 MG tablet Take 100 mg by mouth daily as needed for erectile dysfunction.  . traZODone (DESYREL) 50 MG tablet Take 50 mg by mouth at bedtime.  . triamcinolone cream (KENALOG) 0.1 % APP EXT AA BID  . valsartan (DIOVAN) 160 MG tablet Take 160 mg by mouth daily.  Alveda Reasons 20 MG TABS tablet TK 1 T PO QD   No current facility-administered medications for this visit. (Other)      REVIEW OF SYSTEMS: ROS    Positive for: Eyes   Negative for: Constitutional, Gastrointestinal, Neurological,  Skin, Genitourinary, Musculoskeletal, HENT, Endocrine, Cardiovascular, Respiratory, Psychiatric, Allergic/Imm, Heme/Lymph   Last edited by Theodore Demark, COA on 07/29/2020  1:07 PM. (History)       ALLERGIES Allergies  Allergen Reactions  . Benazepril Hcl     cough  . Hctz [Hydrochlorothiazide] Rash    25mg      PAST MEDICAL HISTORY Past Medical History:  Diagnosis Date  . Allergic rhinitis   . Atrial fibrillation ([place]) 2014  . Diabetes mellitus without complication ([place])   . DJD (degenerative joint disease)   . ED (erectile dysfunction)   . GERD (gastroesophageal reflux disease)   . Gout   . Hyperlipidemia   . Hypertension   . Low back pain   . Melanoma ([place]) 8/11   right calf   . Plantar fasciitis 2004  . SOB (shortness of breath)    Past Surgical History:   Procedure Laterality Date  . CATARACT EXTRACTION    . cataract surgery  9/13  . EYE SURGERY    . FRACTURE SURGERY     left leg with Pin  . LITHOTRIPSY    . melanoma surgery  8/11    FAMILY HISTORY Family History  Problem Relation Age of Onset  . Hypertension Mother   . Diabetes Mother   . Hypertension Father   . Asthma Sister   . Hypertension Sister   . Atrial fibrillation Sister     SOCIAL HISTORY Social History   Tobacco Use  . Smoking status: Former Research scientist (life sciences)  . Smokeless tobacco: Never Used  Vaping Use  . Vaping Use: Never used  Substance Use Topics  . Alcohol use: Yes  . Drug use: No         OPHTHALMIC EXAM:  Base Eye Exam    Visual Acuity (Snellen - Linear)      Right Left   Dist [place] 20/30 +2 20/20 -1   Dist ph [place] 20/20 -1        Tonometry (Tonopen, 1:03 PM)      Right Left   Pressure 21 22       Pupils      Dark Light Shape React APD   Right 3 2 Round Brisk None   Left 3 2 Round Brisk None       Visual Fields (Counting fingers)      Left Right    Full Full       Extraocular Movement      Right Left    Full, Ortho Full, Ortho       Neuro/Psych    Oriented x3: Yes   Mood/Affect: Normal       Dilation    Both eyes: 1.0% Mydriacyl, 2.5% Phenylephrine @ 1:03 PM        Slit Lamp and Fundus Exam    Slit Lamp Exam      Right Left   Lids/Lashes Dermatochalasis - upper lid Dermatochalasis - upper lid   Conjunctiva/Sclera Mild Nasal and temporal Pinguecula Mild Nasal and temporal Pinguecula   Cornea Arcus, Well healed cataract wounds, 4 RK scars at 1030, 0130, 0430, and 0730, 1+Punctate epithelial erosions, +tear film debris Arcus, Well healed cataract wounds, 4 RK scars at 1030, 0130, 0430, and 0730, 1+PEE, +Debris in tear film   Anterior Chamber Deep and quiet Deep and quiet   Iris Round and dilated Mild iriodnesis, Round and dilated   Lens PC IOL in good position, 1+Temporal Posterior capsular opacification PC IOL in good position,  trace, nasal Posterior  capsular opacification   Vitreous Vitreous syneresis, Posterior vitreous detachment, vitreous condensations settling inferiorly Vitreous syneresis, Posterior vitreous detachment       Fundus Exam      Right Left   Disc trace pallor, sharp rim trace pallor, sharp rim   C/D Ratio 0.3 0.3   Macula Flat, mild ERM, blunted foveal reflex, mild RPE mottling, No heme or edema Flat, Blunted foveal reflex, mild Retinal pigment epithelial mottling, mild Epiretinal membrane, No heme or edema   Vessels attenuated, mild tortuousity, mild AV crossing changes attenuated, mild tortuousity, mild AV crossing changes   Periphery Attached, scattered RPE changes Attached, scattered RPE changes          IMAGING AND PROCEDURES  Imaging and Procedures for 09/27/17  OCT, Retina - OU - Both Eyes       Right Eye Quality was good. Central Foveal Thickness: 290. Progression has been stable. Findings include no IRF, normal foveal contour, no SRF, epiretinal membrane (Stable ERM -- very mild).   Left Eye Quality was good. Central Foveal Thickness: 305. Progression has been stable. Findings include no IRF, no SRF, epiretinal membrane, retinal drusen , normal foveal contour (Stable ERM ).   Notes *Images captured and stored on drive  Diagnosis / Impression:  OD: NFP, No IRF/SRF -- stable ERM OS: Stable ERM; no IRF/SRF  Clinical management:  See below  Abbreviations: NFP - Normal foveal profile. CME - cystoid macular edema. PED - pigment epithelial detachment. IRF - intraretinal fluid. SRF - subretinal fluid. EZ - ellipsoid zone. ERM - epiretinal membrane. ORA - outer retinal atrophy. ORT - outer retinal tubulation. SRHM - subretinal hyper-reflective material                  ASSESSMENT/PLAN:    ICD-10-CM   1. Epiretinal membrane (ERM) of both eyes  H35.373   2. Posterior vitreous detachment of both eyes  H43.813   3. Retinal edema  H35.81 OCT, Retina - OU - Both Eyes  4.  Pseudophakia of both eyes  Z96.1   5. History of radial keratotomy  Z98.890     1. Epiretinal membrane, OU -- stable  - BCVA 20/20 OU  - OCT with no significant change from prior OU  - no metamorphopsia or blurred vision  - no indication for surgery at this time  - discussed s/s of ERM progression  - F/U 1 year, sooner prn -- DFE/OCT  2. PVD / vitreous syneresis OU             - Discussed findings and prognosis             - No RT or RD on 360 peripheral exam             - Reviewed s/s of RT/RD             - Strict return precautions for any such RT/RD signs/symptoms  3. No retinal edema on exam or OCT  4. Pseudophakia OU             - s/p CE/IOL             - beautiful surgery, doing well             - monitor  5. S/P RK OU-              - by Dr. Gershon Crane             - doing well             -  monitor  Ophthalmic Meds Ordered this visit:  No orders of the defined types were placed in this encounter.      Return in about 1 year (around 07/29/2021) for f/u ERM OU, DFE, OCT.  There are no Patient Instructions on file for this visit.   Explained the diagnoses, plan, and follow up with the patient and they expressed understanding.  Patient expressed understanding of the importance of proper follow up care.   This document serves as a record of services personally performed by Gardiner Sleeper, MD, PhD. It was created on their behalf by Estill Bakes, COT an ophthalmic technician. The creation of this record is the provider's dictation and/or activities during the visit.    Electronically signed by: Estill Bakes, COT 2.16.22 @ 4:45 PM   This document serves as a record of services personally performed by Gardiner Sleeper, MD, PhD. It was created on their behalf by San Jetty. Owens Shark, OA an ophthalmic technician. The creation of this record is the provider's dictation and/or activities during the visit.    Electronically signed by: San Jetty. Owens Shark, [place] 02.22.2022 4:45  PM  Gardiner Sleeper, M.D., Ph.D. Diseases & Surgery of the Retina and [place] 07/29/2020   I have reviewed the above documentation for accuracy and completeness, and I agree with the above. Gardiner Sleeper, M.D., Ph.D. 07/29/20 4:45 PM  Abbreviations: M myopia (nearsighted); A astigmatism; H hyperopia (farsighted); P presbyopia; Mrx spectacle prescription;  CTL contact lenses; OD right eye; OS left eye; OU both eyes  XT exotropia; ET esotropia; PEK punctate epithelial keratitis; PEE punctate epithelial erosions; DES dry eye syndrome; MGD meibomian gland dysfunction; ATs artificial tears; PFAT's preservative free artificial tears; [place] nuclear sclerotic cataract; PSC posterior subcapsular cataract; ERM epi-retinal membrane; PVD posterior vitreous detachment; RD retinal detachment; DM diabetes mellitus; DR diabetic retinopathy; NPDR non-proliferative diabetic retinopathy; PDR proliferative diabetic retinopathy; CSME clinically significant macular edema; DME diabetic macular edema; dbh dot blot hemorrhages; CWS cotton wool spot; POAG primary open angle glaucoma; C/D cup-to-disc ratio; HVF humphrey visual field; GVF goldmann visual field; OCT optical coherence tomography; IOP intraocular pressure; BRVO Branch retinal vein occlusion; CRVO central retinal vein occlusion; CRAO central retinal artery occlusion; BRAO branch retinal artery occlusion; RT retinal tear; SB scleral buckle; PPV pars plana vitrectomy; VH Vitreous hemorrhage; PRP panretinal laser photocoagulation; IVK intravitreal kenalog; VMT vitreomacular traction; MH Macular hole;  NVD neovascularization of the disc; NVE neovascularization elsewhere; AREDS age related eye disease study; ARMD age related macular degeneration; POAG primary open angle glaucoma; EBMD epithelial/anterior basement membrane dystrophy; ACIOL anterior chamber intraocular lens; IOL intraocular lens; PCIOL posterior chamber intraocular lens;  Phaco/IOL phacoemulsification with intraocular lens placement; [place] photorefractive keratectomy; LASIK laser assisted in situ keratomileusis; HTN hypertension; DM diabetes mellitus; COPD chronic obstructive pulmonary disease

## 2020-07-29 ENCOUNTER — Other Ambulatory Visit: Payer: Self-pay

## 2020-07-29 ENCOUNTER — Encounter (INDEPENDENT_AMBULATORY_CARE_PROVIDER_SITE_OTHER): Payer: Self-pay | Admitting: Ophthalmology

## 2020-07-29 ENCOUNTER — Ambulatory Visit (INDEPENDENT_AMBULATORY_CARE_PROVIDER_SITE_OTHER): Payer: Medicare Other | Admitting: Ophthalmology

## 2020-07-29 DIAGNOSIS — H43813 Vitreous degeneration, bilateral: Secondary | ICD-10-CM

## 2020-07-29 DIAGNOSIS — H35373 Puckering of macula, bilateral: Secondary | ICD-10-CM | POA: Diagnosis not present

## 2020-07-29 DIAGNOSIS — H3581 Retinal edema: Secondary | ICD-10-CM

## 2020-07-29 DIAGNOSIS — Z961 Presence of intraocular lens: Secondary | ICD-10-CM

## 2020-07-29 DIAGNOSIS — Z9889 Other specified postprocedural states: Secondary | ICD-10-CM
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
## 2020-09-16 DIAGNOSIS — H43391 Other vitreous opacities, right eye: Secondary | ICD-10-CM | POA: Diagnosis not present

## 2020-09-16 DIAGNOSIS — H04123 Dry eye syndrome of bilateral lacrimal glands: Secondary | ICD-10-CM | POA: Diagnosis not present

## 2020-09-16 DIAGNOSIS — H524 Presbyopia: Secondary | ICD-10-CM | POA: Diagnosis not present

## 2020-10-29 DIAGNOSIS — L570 Actinic keratosis: Secondary | ICD-10-CM | POA: Diagnosis not present

## 2020-10-29 DIAGNOSIS — L218 Other seborrheic dermatitis: Secondary | ICD-10-CM | POA: Diagnosis not present

## 2020-10-29 DIAGNOSIS — C44319 Basal cell carcinoma of skin of other parts of face: Secondary | ICD-10-CM | POA: Diagnosis not present

## 2020-10-29 DIAGNOSIS — X32XXXD Exposure to sunlight, subsequent encounter: Secondary | ICD-10-CM | POA: Diagnosis not present

## 2020-11-25 DIAGNOSIS — Z1389 Encounter for screening for other disorder: Secondary | ICD-10-CM | POA: Diagnosis not present

## 2020-11-25 DIAGNOSIS — M109 Gout, unspecified: Secondary | ICD-10-CM | POA: Diagnosis not present

## 2020-11-25 DIAGNOSIS — Z0000 Encounter for general adult medical examination without abnormal findings: Secondary | ICD-10-CM | POA: Diagnosis not present

## 2020-11-25 DIAGNOSIS — Z23 Encounter for immunization: Secondary | ICD-10-CM | POA: Diagnosis not present

## 2020-11-25 DIAGNOSIS — K219 Gastro-esophageal reflux disease without esophagitis: Secondary | ICD-10-CM | POA: Diagnosis not present

## 2020-11-25 DIAGNOSIS — I480 Paroxysmal atrial fibrillation: Secondary | ICD-10-CM | POA: Diagnosis not present

## 2020-11-25 DIAGNOSIS — R7301 Impaired fasting glucose: Secondary | ICD-10-CM | POA: Diagnosis not present

## 2020-11-25 DIAGNOSIS — I10 Essential (primary) hypertension: Secondary | ICD-10-CM | POA: Diagnosis not present

## 2021-01-30 DIAGNOSIS — F418 Other specified anxiety disorders: Secondary | ICD-10-CM | POA: Diagnosis not present

## 2021-02-02 DIAGNOSIS — I10 Essential (primary) hypertension: Secondary | ICD-10-CM | POA: Diagnosis not present

## 2021-02-02 DIAGNOSIS — E785 Hyperlipidemia, unspecified: Secondary | ICD-10-CM | POA: Diagnosis not present

## 2021-02-02 DIAGNOSIS — I480 Paroxysmal atrial fibrillation: Secondary | ICD-10-CM | POA: Diagnosis not present

## 2021-02-16 DIAGNOSIS — I480 Paroxysmal atrial fibrillation: Secondary | ICD-10-CM | POA: Diagnosis not present

## 2021-02-16 DIAGNOSIS — I10 Essential (primary) hypertension: Secondary | ICD-10-CM | POA: Diagnosis not present

## 2021-02-16 DIAGNOSIS — F419 Anxiety disorder, unspecified: Secondary | ICD-10-CM | POA: Diagnosis not present

## 2021-03-26 DIAGNOSIS — Z23 Encounter for immunization: Secondary | ICD-10-CM | POA: Diagnosis not present

## 2021-04-01 DIAGNOSIS — R0602 Shortness of breath: Secondary | ICD-10-CM | POA: Diagnosis not present

## 2021-04-01 DIAGNOSIS — R030 Elevated blood-pressure reading, without diagnosis of hypertension: Secondary | ICD-10-CM | POA: Diagnosis not present

## 2021-04-01 DIAGNOSIS — R200 Anesthesia of skin: Secondary | ICD-10-CM | POA: Diagnosis not present

## 2021-04-01 DIAGNOSIS — R079 Chest pain, unspecified: Secondary | ICD-10-CM | POA: Diagnosis not present

## 2021-04-06 DIAGNOSIS — R202 Paresthesia of skin: Secondary | ICD-10-CM | POA: Diagnosis not present

## 2021-04-06 DIAGNOSIS — F419 Anxiety disorder, unspecified: Secondary | ICD-10-CM | POA: Diagnosis not present

## 2021-05-11 DIAGNOSIS — I480 Paroxysmal atrial fibrillation: Secondary | ICD-10-CM | POA: Diagnosis not present

## 2021-05-11 DIAGNOSIS — F4321 Adjustment disorder with depressed mood: Secondary | ICD-10-CM | POA: Diagnosis not present

## 2021-05-11 DIAGNOSIS — K219 Gastro-esophageal reflux disease without esophagitis: Secondary | ICD-10-CM | POA: Diagnosis not present

## 2021-05-11 DIAGNOSIS — I10 Essential (primary) hypertension: Secondary | ICD-10-CM | POA: Diagnosis not present

## 2021-05-20 DIAGNOSIS — D0462 Carcinoma in situ of skin of left upper limb, including shoulder: Secondary | ICD-10-CM | POA: Diagnosis not present

## 2021-05-20 DIAGNOSIS — Z1283 Encounter for screening for malignant neoplasm of skin: Secondary | ICD-10-CM | POA: Diagnosis not present

## 2021-05-20 DIAGNOSIS — L821 Other seborrheic keratosis: Secondary | ICD-10-CM | POA: Diagnosis not present

## 2021-05-20 DIAGNOSIS — Z08 Encounter for follow-up examination after completed treatment for malignant neoplasm: Secondary | ICD-10-CM | POA: Diagnosis not present

## 2021-05-20 DIAGNOSIS — D225 Melanocytic nevi of trunk: Secondary | ICD-10-CM | POA: Diagnosis not present

## 2021-05-20 DIAGNOSIS — L814 Other melanin hyperpigmentation: Secondary | ICD-10-CM | POA: Diagnosis not present

## 2021-05-20 DIAGNOSIS — D2262 Melanocytic nevi of left upper limb, including shoulder: Secondary | ICD-10-CM | POA: Diagnosis not present

## 2021-05-20 DIAGNOSIS — Z85820 Personal history of malignant melanoma of skin: Secondary | ICD-10-CM | POA: Diagnosis not present

## 2021-06-17 DIAGNOSIS — C44311 Basal cell carcinoma of skin of nose: Secondary | ICD-10-CM | POA: Diagnosis not present

## 2021-06-17 DIAGNOSIS — B078 Other viral warts: Secondary | ICD-10-CM | POA: Diagnosis not present

## 2021-06-17 DIAGNOSIS — Z08 Encounter for follow-up examination after completed treatment for malignant neoplasm: Secondary | ICD-10-CM | POA: Diagnosis not present

## 2021-06-17 DIAGNOSIS — Z85828 Personal history of other malignant neoplasm of skin: Secondary | ICD-10-CM | POA: Diagnosis not present

## 2021-07-15 DIAGNOSIS — Z85828 Personal history of other malignant neoplasm of skin: Secondary | ICD-10-CM | POA: Diagnosis not present

## 2021-07-15 DIAGNOSIS — L570 Actinic keratosis: Secondary | ICD-10-CM | POA: Diagnosis not present

## 2021-07-15 DIAGNOSIS — X32XXXD Exposure to sunlight, subsequent encounter: Secondary | ICD-10-CM | POA: Diagnosis not present

## 2021-07-15 DIAGNOSIS — Z08 Encounter for follow-up examination after completed treatment for malignant neoplasm: Secondary | ICD-10-CM | POA: Diagnosis not present

## 2021-07-22 NOTE — Progress Notes (Signed)
[facility] Note  07/29/2021     CHIEF COMPLAINT Patient presents for Retina Follow Up   HISTORY OF PRESENT ILLNESS: Lance Davenport is a 81 y.o. male who presents to the clinic today for:  HPI     Retina Follow Up   Patient presents with  Other.  In both eyes.  Severity is moderate.  Duration of 12 months.  Since onset it is stable.  I, the attending physician,  performed the HPI with the patient and updated documentation appropriately.        Comments   Pt here for 1 year ret f/u for ERM OU. Pt states he has still been experiencing shadows across his vision OU. Says this has been an ongoing issue for the past 4 years but it seemed to get a bit better when he was taken down to Gabapentin BID versus TID. Pt is unsure if this is directly correlated. He has been told in the past it is dry eye but pt is using Restasis BID OU which is helping the dryness but not the shadowing.       Last edited by Lance Caffey, MD on 07/31/2021 12:10 AM.    Pt states he is still having "cloudy vision", he states every time he saw Dr. Gershon Davenport, he said it was dry eyes, pt states he went to [facility] in [place] last year and was put on Restasis, he states he has been on it for the past 9 months, he states the drops make his eyes feel better, but he still has the cloudy vision   Referring physician: Rutherford Guys, MD [place],  [place] [address]  HISTORICAL INFORMATION:   Selected notes from the [facility] Referred by Dr. Dr. Gershon Davenport for concern of ARMD OU LEE: 04.18.19 Lance Davenport) [BCVA: OD: 20/40 OS: 20/20] Ocular Hx- Pseudophakia OU, bilateral presbyopia, s/p RK OU PMH- Arthritis, HTN, DM (no DM meds on file/documented), SOB, Hx of melanoma, former smoker   CURRENT MEDICATIONS: No current outpatient medications on file. (Ophthalmic Drugs)   No current facility-administered medications for this visit. (Ophthalmic Drugs)   Current Outpatient  Medications (Other)  Medication Sig   allopurinol (ZYLOPRIM) 100 MG tablet Take 100 mg by mouth daily.   clonazePAM (KLONOPIN) 1 MG tablet TK  1/2 T   PO  ONCE A DAY IF NEEDED   Cyanocobalamin (VITAMIN B 12 PO) Take by mouth.   diltiazem (TIAZAC) 240 MG 24 hr capsule Take 240 mg by mouth daily.   ezetimibe (ZETIA) 10 MG tablet Take 10 mg by mouth daily.   gabapentin (NEURONTIN) 300 MG capsule TK 1 C PO BID   lansoprazole (PREVACID) 15 MG capsule Take 15 mg by mouth daily at 12 noon.   losartan (COZAAR) 100 MG tablet Take 50 mg by mouth daily.    meloxicam (MOBIC) 15 MG tablet Take 1 tablet (15 mg total) by mouth daily.   Multiple Vitamin (MULTIVITAMIN) tablet Take 1 tablet by mouth daily.   rosuvastatin (CRESTOR) 5 MG tablet Take 5 mg by mouth daily. Reported on 09/29/2015   sildenafil (REVATIO) 20 MG tablet TK 2 TO 5 TS PO QD PRN   sildenafil (VIAGRA) 100 MG tablet Take 100 mg by mouth daily as needed for erectile dysfunction.   traZODone (DESYREL) 50 MG tablet Take 50 mg by mouth at bedtime.   triamcinolone cream (KENALOG) 0.1 % APP EXT AA BID   valsartan (DIOVAN) 160  MG tablet Take 160 mg by mouth daily.   XARELTO 20 MG TABS tablet TK 1 T PO QD   No current facility-administered medications for this visit. (Other)   REVIEW OF SYSTEMS: ROS   Positive for: Eyes Negative for: Constitutional, Gastrointestinal, Neurological, Skin, Genitourinary, Musculoskeletal, HENT, Endocrine, Cardiovascular, Respiratory, Psychiatric, Allergic/Imm, Heme/Lymph Last edited by Lance Davenport, COT on 07/29/2021 12:59 PM.     ALLERGIES Allergies  Allergen Reactions   Benazepril Hcl     cough   Hctz [Hydrochlorothiazide] Rash    27m    PAST MEDICAL HISTORY Past Medical History:  Diagnosis Date   Allergic rhinitis    Atrial fibrillation ([place] 2014   Diabetes mellitus without complication (HCC)    DJD (degenerative joint disease)    ED (erectile dysfunction)    GERD (gastroesophageal reflux  disease)    Gout    Hyperlipidemia    Hypertension    Low back pain    [place] 8/11   right calf    Plantar fasciitis 2004   SOB (shortness of breath)    Past Surgical History:  Procedure Laterality Date   CATARACT EXTRACTION     cataract surgery  9/13   EYE SURGERY     FRACTURE SURGERY     left leg with Pin   LITHOTRIPSY     melanoma surgery  8/11   FAMILY HISTORY Family History  Problem Relation Age of Onset   Hypertension Mother    Diabetes Mother    Hypertension Father    Asthma Sister    Hypertension Sister    Atrial fibrillation Sister    SOCIAL HISTORY Social History   Tobacco Use   Smoking status: Former   Smokeless tobacco: Never  VScientific laboratory technicianUse: Never used  Substance Use Topics   Alcohol use: Yes   Drug use: No       OPHTHALMIC EXAM:  Base Eye Exam     Visual Acuity (Snellen - Linear)       Right Left   Dist [place] 20/30 -1 20/20   Dist ph [place] 20/20 -2          Tonometry (Tonopen, 1:10 PM)       Right Left   Pressure 17 19         Pupils       Dark Light Shape React APD   Right 2 1 Round Minimal None   Left 2 1 Round Minimal None         Visual Fields (Counting fingers)       Left Right    Full Full         Extraocular Movement       Right Left    Full, Ortho Full, Ortho         Neuro/Psych     Oriented x3: Yes   Mood/Affect: Normal         Dilation     Both eyes: 1.0% Mydriacyl, 2.5% Phenylephrine @ 1:11 PM           Slit Lamp and Fundus Exam     Slit Lamp Exam       Right Left   Lids/Lashes Dermatochalasis - upper lid Dermatochalasis - upper lid   Conjunctiva/Sclera Mild Nasal and temporal Pinguecula Mild Nasal and temporal Pinguecula   Cornea Arcus, Well healed cataract wounds, 4 RK scars at 1030, 0130, 0430, and 0730, trace tear film debris Arcus, Well healed cataract wounds, 4  RK scars at 1030, 0130, 0430, and 0730, +Debris in tear film   Anterior Chamber Deep and quiet Deep  and quiet   Iris Round and dilated Mild iriodnesis, Round and dilated   Lens PC IOL in good position, 1+Temporal Posterior capsular opacification PC IOL in good position, trace, nasal Posterior capsular opacification   Anterior Vitreous Vitreous syneresis, Posterior vitreous detachment, vitreous condensations settling inferiorly Vitreous syneresis, Posterior vitreous detachment         Fundus Exam       Right Left   Disc trace pallor, sharp rim trace pallor, sharp rim   C/D Ratio 0.3 0.3   Macula Flat, mild ERM, blunted foveal reflex, mild RPE mottling, No heme or edema Flat, Blunted foveal reflex, mild Retinal pigment epithelial mottling, mild Epiretinal membrane, No heme or edema   Vessels attenuated, mild AV crossing changes attenuated, Tortuous, mild Copper wiring, mild AV crossing changes   Periphery Attached, scattered RPE changes Attached, scattered RPE changes, mild peripheral cystoid degeneration            IMAGING AND PROCEDURES  Imaging and Procedures for 09/27/17  OCT, Retina - OU - Both Eyes       Right Eye Quality was good. Central Foveal Thickness: 282. Progression has been stable. Findings include no IRF, normal foveal contour, no SRF, epiretinal membrane (Trace ERM -- stable).   Left Eye Quality was good. Central Foveal Thickness: 293. Progression has been stable. Findings include no IRF, no SRF, epiretinal membrane, retinal drusen , abnormal foveal contour (Mild ERM with sharpening of temporal foveal contour).   Notes *Images captured and stored on drive  Diagnosis / Impression:  OD: NFP, No IRF/SRF -- Trace ERM -- stable OS: Mild ERM with sharpening of temporal foveal contour  Clinical management:  See below  Abbreviations: NFP - Normal foveal profile. CME - cystoid macular edema. PED - pigment epithelial detachment. IRF - intraretinal fluid. SRF - subretinal fluid. EZ - ellipsoid zone. ERM - epiretinal membrane. ORA - outer retinal atrophy. ORT - outer  retinal tubulation. SRHM - subretinal hyper-reflective material             ASSESSMENT/PLAN:    ICD-10-CM   1. Epiretinal membrane (ERM) of both eyes  H35.373 OCT, Retina - OU - Both Eyes    2. Posterior vitreous detachment of both eyes  H43.813     3. Pseudophakia of both eyes  Z96.1     4. History of radial keratotomy  Z98.890      1. Epiretinal membrane, OU -- stable  - BCVA 2020 OU-stable  - OCT OD: NFP, No IRF/SRF -- Trace ERM -- stable; OS: Mild ERM with sharpening of temporal foveal contour  - no metamorphopsia or blurred vision  - no indication for surgery at this time  - discussed s/s of ERM progression  - F/U 1 year, sooner prn -- DFE/OCT   2. PVD / vitreous syneresis OU             - Discussed findings and prognosis             - No RT or RD on 360 peripheral exam             - Reviewed s/s of RT/RD             - Strict return precautions for any such RT/RD signs/symptoms   3. Pseudophakia OU             - s/p  CE/IOL             - beautiful surgery, doing well             - monitor   4. S/P RK OU-              - by Dr. Gershon Davenport             - doing well             - monitor  Ophthalmic Meds Ordered this visit:  No orders of the defined types were placed in this encounter.     Return in about 1 year (around 07/29/2022) for f/u ERM OU, DFE, OCT.  There are no Patient Instructions on file for this visit.   Explained the diagnoses, plan, and follow up with the patient and they expressed understanding.  Patient expressed understanding of the importance of proper follow up care.   This document serves as a record of services personally performed by Gardiner Sleeper, MD, PhD. It was created on their behalf by Orvan Falconer, an ophthalmic technician. The creation of this record is the provider's dictation and/or activities during the visit.    Electronically signed by: Orvan Falconer, OA, 07/31/21  12:12 AM  This document serves as a record of  services personally performed by Gardiner Sleeper, MD, PhD. It was created on their behalf by San Jetty. Owens Shark, OA an ophthalmic technician. The creation of this record is the provider's dictation and/or activities during the visit.    Electronically signed by: San Jetty. Owens Shark, [place] 02.22.2023 12:12 AM  Gardiner Sleeper, M.D., Ph.D. Diseases & Surgery of the Retina and Vitreous [facility]  I have reviewed the above documentation for accuracy and completeness, and I agree with the above. Gardiner Sleeper, M.D., Ph.D. 07/31/21 12:14 AM   Abbreviations: M myopia (nearsighted); A astigmatism; H hyperopia (farsighted); P presbyopia; Mrx spectacle prescription;  CTL contact lenses; OD right eye; OS left eye; OU both eyes  XT exotropia; ET esotropia; PEK punctate epithelial keratitis; PEE punctate epithelial erosions; DES dry eye syndrome; MGD meibomian gland dysfunction; ATs artificial tears; PFAT's preservative free artificial tears; [place] nuclear sclerotic cataract; PSC posterior subcapsular cataract; ERM epi-retinal membrane; PVD posterior vitreous detachment; RD retinal detachment; DM diabetes mellitus; DR diabetic retinopathy; NPDR non-proliferative diabetic retinopathy; PDR proliferative diabetic retinopathy; CSME clinically significant macular edema; DME diabetic macular edema; dbh dot blot hemorrhages; CWS cotton wool spot; POAG primary open angle glaucoma; C/D cup-to-disc ratio; HVF humphrey visual field; GVF goldmann visual field; OCT optical coherence tomography; IOP intraocular pressure; BRVO Branch retinal vein occlusion; CRVO central retinal vein occlusion; CRAO central retinal artery occlusion; BRAO branch retinal artery occlusion; RT retinal tear; SB scleral buckle; PPV pars plana vitrectomy; VH Vitreous hemorrhage; PRP panretinal laser photocoagulation; IVK intravitreal kenalog; VMT vitreomacular traction; MH Macular hole;  NVD neovascularization of the disc; NVE  neovascularization elsewhere; AREDS age related eye disease study; ARMD age related macular degeneration; POAG primary open angle glaucoma; EBMD epithelial/anterior basement membrane dystrophy; ACIOL anterior chamber intraocular lens; IOL intraocular lens; PCIOL posterior chamber intraocular lens; Phaco/IOL phacoemulsification with intraocular lens placement; [place] photorefractive keratectomy; LASIK laser assisted in situ keratomileusis; HTN hypertension; DM diabetes mellitus; COPD chronic obstructive pulmonary disease

## 2021-07-23 DIAGNOSIS — Z03818 Encounter for observation for suspected exposure to other biological agents ruled out: Secondary | ICD-10-CM | POA: Diagnosis not present

## 2021-07-23 DIAGNOSIS — J3489 Other specified disorders of nose and nasal sinuses: Secondary | ICD-10-CM | POA: Diagnosis not present

## 2021-07-23 DIAGNOSIS — R051 Acute cough: Secondary | ICD-10-CM | POA: Diagnosis not present

## 2021-07-29 ENCOUNTER — Encounter (INDEPENDENT_AMBULATORY_CARE_PROVIDER_SITE_OTHER): Payer: Self-pay | Admitting: Ophthalmology

## 2021-07-29 ENCOUNTER — Ambulatory Visit (INDEPENDENT_AMBULATORY_CARE_PROVIDER_SITE_OTHER): Payer: Medicare Other | Admitting: Ophthalmology

## 2021-07-29 ENCOUNTER — Other Ambulatory Visit: Payer: Self-pay

## 2021-07-29 DIAGNOSIS — H35373 Puckering of macula, bilateral: Secondary | ICD-10-CM | POA: Diagnosis not present

## 2021-07-29 DIAGNOSIS — Z961 Presence of intraocular lens: Secondary | ICD-10-CM | POA: Diagnosis not present

## 2021-07-29 DIAGNOSIS — Z9889 Other specified postprocedural states: Secondary | ICD-10-CM

## 2021-07-29 DIAGNOSIS — H43813 Vitreous degeneration, bilateral: Secondary | ICD-10-CM | POA: Diagnosis not present

## 2021-07-31 ENCOUNTER — Encounter (INDEPENDENT_AMBULATORY_CARE_PROVIDER_SITE_OTHER): Payer: Self-pay | Admitting: Ophthalmology

## 2021-08-05 DIAGNOSIS — H539 Unspecified visual disturbance: Secondary | ICD-10-CM | POA: Diagnosis not present

## 2021-08-07 DIAGNOSIS — R300 Dysuria: Secondary | ICD-10-CM | POA: Diagnosis not present

## 2021-08-10 DIAGNOSIS — E785 Hyperlipidemia, unspecified: Secondary | ICD-10-CM | POA: Diagnosis not present

## 2021-08-10 DIAGNOSIS — I4819 Other persistent atrial fibrillation: Secondary | ICD-10-CM | POA: Diagnosis not present

## 2021-08-10 DIAGNOSIS — N390 Urinary tract infection, site not specified: Secondary | ICD-10-CM | POA: Diagnosis not present

## 2021-08-10 DIAGNOSIS — I10 Essential (primary) hypertension: Secondary | ICD-10-CM | POA: Diagnosis not present

## 2021-08-24 DIAGNOSIS — N3000 Acute cystitis without hematuria: Secondary | ICD-10-CM | POA: Diagnosis not present

## 2021-08-27 DIAGNOSIS — R202 Paresthesia of skin: Secondary | ICD-10-CM | POA: Diagnosis not present

## 2021-09-08 DIAGNOSIS — L570 Actinic keratosis: Secondary | ICD-10-CM | POA: Diagnosis not present

## 2021-09-08 DIAGNOSIS — C44319 Basal cell carcinoma of skin of other parts of face: Secondary | ICD-10-CM | POA: Diagnosis not present

## 2021-09-08 DIAGNOSIS — D044 Carcinoma in situ of skin of scalp and neck: Secondary | ICD-10-CM | POA: Diagnosis not present

## 2021-09-08 DIAGNOSIS — X32XXXD Exposure to sunlight, subsequent encounter: Secondary | ICD-10-CM | POA: Diagnosis not present

## 2021-09-16 DIAGNOSIS — R300 Dysuria: Secondary | ICD-10-CM | POA: Diagnosis not present

## 2021-09-22 DIAGNOSIS — D122 Benign neoplasm of ascending colon: Secondary | ICD-10-CM | POA: Diagnosis not present

## 2021-09-22 DIAGNOSIS — Z86010 Personal history of colonic polyps: Secondary | ICD-10-CM | POA: Diagnosis not present

## 2021-09-22 DIAGNOSIS — K649 Unspecified hemorrhoids: Secondary | ICD-10-CM | POA: Diagnosis not present

## 2021-09-22 DIAGNOSIS — K5730 Diverticulosis of large intestine without perforation or abscess without bleeding: Secondary | ICD-10-CM | POA: Diagnosis not present

## 2021-09-22 DIAGNOSIS — D128 Benign neoplasm of rectum: Secondary | ICD-10-CM | POA: Diagnosis not present

## 2021-09-25 DIAGNOSIS — D122 Benign neoplasm of ascending colon: Secondary | ICD-10-CM | POA: Diagnosis not present

## 2021-09-30 DIAGNOSIS — G629 Polyneuropathy, unspecified: Secondary | ICD-10-CM | POA: Diagnosis not present

## 2021-09-30 DIAGNOSIS — R0789 Other chest pain: Secondary | ICD-10-CM | POA: Diagnosis not present

## 2021-10-13 DIAGNOSIS — I454 Nonspecific intraventricular block: Secondary | ICD-10-CM | POA: Diagnosis not present

## 2021-10-13 DIAGNOSIS — Z01812 Encounter for preprocedural laboratory examination: Secondary | ICD-10-CM | POA: Diagnosis not present

## 2021-10-13 DIAGNOSIS — I4819 Other persistent atrial fibrillation: Secondary | ICD-10-CM | POA: Diagnosis not present

## 2021-10-13 DIAGNOSIS — I4891 Unspecified atrial fibrillation: Secondary | ICD-10-CM | POA: Diagnosis not present

## 2021-10-14 DIAGNOSIS — Z79899 Other long term (current) drug therapy: Secondary | ICD-10-CM | POA: Diagnosis not present

## 2021-10-14 DIAGNOSIS — Z7901 Long term (current) use of anticoagulants: Secondary | ICD-10-CM | POA: Diagnosis not present

## 2021-10-14 DIAGNOSIS — E669 Obesity, unspecified: Secondary | ICD-10-CM | POA: Diagnosis not present

## 2021-10-14 DIAGNOSIS — E785 Hyperlipidemia, unspecified: Secondary | ICD-10-CM | POA: Diagnosis not present

## 2021-10-14 DIAGNOSIS — K219 Gastro-esophageal reflux disease without esophagitis: Secondary | ICD-10-CM | POA: Diagnosis not present

## 2021-10-14 DIAGNOSIS — I4819 Other persistent atrial fibrillation: Secondary | ICD-10-CM | POA: Diagnosis not present

## 2021-10-14 DIAGNOSIS — Z6832 Body mass index (BMI) 32.0-32.9, adult: Secondary | ICD-10-CM | POA: Diagnosis not present

## 2021-10-14 DIAGNOSIS — I10 Essential (primary) hypertension: Secondary | ICD-10-CM | POA: Diagnosis not present

## 2021-10-14 DIAGNOSIS — N390 Urinary tract infection, site not specified: Secondary | ICD-10-CM | POA: Diagnosis not present

## 2021-10-20 DIAGNOSIS — X32XXXD Exposure to sunlight, subsequent encounter: Secondary | ICD-10-CM | POA: Diagnosis not present

## 2021-10-20 DIAGNOSIS — H524 Presbyopia: Secondary | ICD-10-CM | POA: Diagnosis not present

## 2021-10-20 DIAGNOSIS — L570 Actinic keratosis: Secondary | ICD-10-CM | POA: Diagnosis not present

## 2021-10-20 DIAGNOSIS — Z08 Encounter for follow-up examination after completed treatment for malignant neoplasm: Secondary | ICD-10-CM | POA: Diagnosis not present

## 2021-10-20 DIAGNOSIS — Z85828 Personal history of other malignant neoplasm of skin: Secondary | ICD-10-CM | POA: Diagnosis not present

## 2021-11-19 DIAGNOSIS — Z03818 Encounter for observation for suspected exposure to other biological agents ruled out: Secondary | ICD-10-CM | POA: Diagnosis not present

## 2021-11-19 DIAGNOSIS — R0982 Postnasal drip: Secondary | ICD-10-CM | POA: Diagnosis not present

## 2021-11-19 DIAGNOSIS — R051 Acute cough: Secondary | ICD-10-CM | POA: Diagnosis not present

## 2021-11-19 DIAGNOSIS — J029 Acute pharyngitis, unspecified: Secondary | ICD-10-CM | POA: Diagnosis not present

## 2021-11-23 DIAGNOSIS — I10 Essential (primary) hypertension: Secondary | ICD-10-CM | POA: Diagnosis not present

## 2021-11-23 DIAGNOSIS — E785 Hyperlipidemia, unspecified: Secondary | ICD-10-CM | POA: Diagnosis not present

## 2021-11-23 DIAGNOSIS — I4891 Unspecified atrial fibrillation: Secondary | ICD-10-CM | POA: Diagnosis not present

## 2021-12-09 DIAGNOSIS — Z1331 Encounter for screening for depression: Secondary | ICD-10-CM | POA: Diagnosis not present

## 2021-12-09 DIAGNOSIS — E7800 Pure hypercholesterolemia, unspecified: Secondary | ICD-10-CM | POA: Diagnosis not present

## 2021-12-09 DIAGNOSIS — F419 Anxiety disorder, unspecified: Secondary | ICD-10-CM | POA: Diagnosis not present

## 2021-12-09 DIAGNOSIS — Z0000 Encounter for general adult medical examination without abnormal findings: Secondary | ICD-10-CM | POA: Diagnosis not present

## 2021-12-09 DIAGNOSIS — I480 Paroxysmal atrial fibrillation: Secondary | ICD-10-CM | POA: Diagnosis not present

## 2021-12-09 DIAGNOSIS — K219 Gastro-esophageal reflux disease without esophagitis: Secondary | ICD-10-CM | POA: Diagnosis not present

## 2021-12-09 DIAGNOSIS — I10 Essential (primary) hypertension: Secondary | ICD-10-CM | POA: Diagnosis not present

## 2021-12-09 DIAGNOSIS — M109 Gout, unspecified: Secondary | ICD-10-CM | POA: Diagnosis not present

## 2022-01-12 DIAGNOSIS — C44529 Squamous cell carcinoma of skin of other part of trunk: Secondary | ICD-10-CM | POA: Diagnosis not present

## 2022-01-12 DIAGNOSIS — C44629 Squamous cell carcinoma of skin of left upper limb, including shoulder: Secondary | ICD-10-CM | POA: Diagnosis not present

## 2022-01-13 DIAGNOSIS — F419 Anxiety disorder, unspecified: Secondary | ICD-10-CM | POA: Diagnosis not present

## 2022-01-27 DIAGNOSIS — Z08 Encounter for follow-up examination after completed treatment for malignant neoplasm: Secondary | ICD-10-CM | POA: Diagnosis not present

## 2022-01-27 DIAGNOSIS — Z85828 Personal history of other malignant neoplasm of skin: Secondary | ICD-10-CM | POA: Diagnosis not present

## 2022-02-23 DIAGNOSIS — Z85828 Personal history of other malignant neoplasm of skin: Secondary | ICD-10-CM | POA: Diagnosis not present

## 2022-02-23 DIAGNOSIS — X32XXXD Exposure to sunlight, subsequent encounter: Secondary | ICD-10-CM | POA: Diagnosis not present

## 2022-02-23 DIAGNOSIS — Z08 Encounter for follow-up examination after completed treatment for malignant neoplasm: Secondary | ICD-10-CM | POA: Diagnosis not present

## 2022-02-23 DIAGNOSIS — L570 Actinic keratosis: Secondary | ICD-10-CM | POA: Diagnosis not present

## 2022-03-07 DEATH — deceased

## 2022-07-29 NOTE — Progress Notes (Shared)
[facility] Note  07/30/2022     CHIEF COMPLAINT Patient presents for No chief complaint on file.   HISTORY OF PRESENT ILLNESS: Lance Davenport is a 82 y.o. male who presents to the clinic today for:   Pt states he is still having "cloudy vision", he states every time he saw Dr. Gershon Crane, he said it was dry eyes, pt states he went to [facility] in [place] last year and was put on Restasis, he states he has been on it for the past 9 months, he states the drops make his eyes feel better, but he still has the cloudy vision   Referring physician: Lavone Orn, MD [place] [place],  [place] [address]  HISTORICAL INFORMATION:   Selected notes from the MEDICAL RECORD NUMBER Referred by Dr. Dr. Gershon Crane for concern of ARMD OU LEE: 04.18.19 [name]) [BCVA: OD: 20/40 OS: 20/20] Ocular Hx- Pseudophakia OU, bilateral presbyopia, s/p RK OU PMH- Arthritis, HTN, DM (no DM meds on file/documented), SOB, Hx of melanoma, former smoker   CURRENT MEDICATIONS: No current outpatient medications on file. (Ophthalmic Drugs)   No current facility-administered medications for this visit. (Ophthalmic Drugs)   Current Outpatient Medications (Other)  Medication Sig   allopurinol (ZYLOPRIM) 100 MG tablet Take 100 mg by mouth daily.   clonazePAM (KLONOPIN) 1 MG tablet TK  1/2 T   PO  ONCE A DAY IF NEEDED   Cyanocobalamin (VITAMIN B 12 PO) Take by mouth.   diltiazem (TIAZAC) 240 MG 24 hr capsule Take 240 mg by mouth daily.   ezetimibe (ZETIA) 10 MG tablet Take 10 mg by mouth daily.   gabapentin (NEURONTIN) 300 MG capsule TK 1 C PO BID   lansoprazole (PREVACID) 15 MG capsule Take 15 mg by mouth daily at 12 noon.   losartan (COZAAR) 100 MG tablet Take 50 mg by mouth daily.    meloxicam (MOBIC) 15 MG tablet Take 1 tablet (15 mg total) by mouth daily.   Multiple Vitamin (MULTIVITAMIN) tablet Take 1 tablet by mouth daily.   rosuvastatin (CRESTOR) 5 MG tablet Take 5  mg by mouth daily. Reported on 09/29/2015   sildenafil (REVATIO) 20 MG tablet TK 2 TO 5 TS PO QD PRN   sildenafil (VIAGRA) 100 MG tablet Take 100 mg by mouth daily as needed for erectile dysfunction.   traZODone (DESYREL) 50 MG tablet Take 50 mg by mouth at bedtime.   triamcinolone cream (KENALOG) 0.1 % APP EXT AA BID   valsartan (DIOVAN) 160 MG tablet Take 160 mg by mouth daily.   XARELTO 20 MG TABS tablet TK 1 T PO QD   No current facility-administered medications for this visit. (Other)   REVIEW OF SYSTEMS:   ALLERGIES Allergies  Allergen Reactions   Benazepril Hcl     cough   Hctz [Hydrochlorothiazide] Rash    52m    PAST MEDICAL HISTORY Past Medical History:  Diagnosis Date   Allergic rhinitis    Atrial fibrillation ([place]   Diabetes mellitus without complication (HCC)    DJD (degenerative joint disease)    ED (erectile dysfunction)    GERD (gastroesophageal reflux disease)    Gout    Hyperlipidemia    Hypertension    Low back pain    [place]   right calf    Plantar fasciitis 2004   SOB (shortness of breath)    Past Surgical History:  Procedure Laterality Date  CATARACT EXTRACTION     cataract surgery  9/13   EYE SURGERY     FRACTURE SURGERY     left leg with Pin   LITHOTRIPSY     melanoma surgery  8/11   FAMILY HISTORY Family History  Problem Relation Age of Onset   Hypertension Mother    Diabetes Mother    Hypertension Father    Asthma Sister    Hypertension Sister    Atrial fibrillation Sister    SOCIAL HISTORY Social History   Tobacco Use   Smoking status: Former   Smokeless tobacco: Never  Scientific laboratory technician Use: Never used  Substance Use Topics   Alcohol use: Yes   Drug use: No       OPHTHALMIC EXAM:  Not recorded     IMAGING AND PROCEDURES  Imaging and Procedures for 09/27/17          ASSESSMENT/PLAN:  No diagnosis found.  1. Epiretinal membrane, OU -- stable  - BCVA 2020 OU-stable  - OCT OD:  NFP, No IRF/SRF -- Trace ERM -- stable; OS: Mild ERM with sharpening of temporal foveal contour  - no metamorphopsia or blurred vision  - no indication for surgery at this time  - discussed s/s of ERM progression  - F/U 1 year, sooner prn -- DFE/OCT   2. PVD / vitreous syneresis OU             - Discussed findings and prognosis             - No RT or RD on 360 peripheral exam             - Reviewed s/s of RT/RD             - Strict return precautions for any such RT/RD signs/symptoms   3. Pseudophakia OU             - s/p CE/IOL             - beautiful surgery, doing well             - monitor   4. S/P RK OU-              - by Dr. Gershon Crane             - doing well             - monitor  Ophthalmic Meds Ordered this visit:  No orders of the defined types were placed in this encounter.     No follow-ups on file.  There are no Patient Instructions on file for this visit.   This document serves as a record of services personally performed by Gardiner Sleeper, MD, PhD. It was created on their behalf by Joetta Manners COT, an ophthalmic technician. The creation of this record is the provider's dictation and/or activities during the visit.    Electronically signed by: Joetta Manners COT 02.22.24 9:40 AM    Abbreviations: M myopia (nearsighted); A astigmatism; H hyperopia (farsighted); P presbyopia; Mrx spectacle prescription;  CTL contact lenses; OD right eye; OS left eye; OU both eyes  XT exotropia; ET esotropia; PEK punctate epithelial keratitis; PEE punctate epithelial erosions; DES dry eye syndrome; MGD meibomian gland dysfunction; ATs artificial tears; PFAT's preservative free artificial tears; [place] nuclear sclerotic cataract; PSC posterior subcapsular cataract; ERM epi-retinal membrane; PVD posterior vitreous detachment; RD retinal detachment; DM diabetes mellitus; DR diabetic retinopathy; NPDR non-proliferative diabetic retinopathy; PDR proliferative diabetic  retinopathy; CSME clinically significant macular edema; DME diabetic macular edema; dbh dot blot hemorrhages; CWS cotton wool spot; POAG primary open angle glaucoma; C/D cup-to-disc ratio; HVF humphrey visual field; GVF goldmann visual field; OCT optical coherence tomography; IOP intraocular pressure; BRVO Branch retinal vein occlusion; CRVO central retinal vein occlusion; CRAO central retinal artery occlusion; BRAO branch retinal artery occlusion; RT retinal tear; SB scleral buckle; PPV pars plana vitrectomy; VH Vitreous hemorrhage; PRP panretinal laser photocoagulation; IVK intravitreal kenalog; VMT vitreomacular traction; MH Macular hole;  NVD neovascularization of the disc; NVE neovascularization elsewhere; AREDS age related eye disease study; ARMD age related macular degeneration; POAG primary open angle glaucoma; EBMD epithelial/anterior basement membrane dystrophy; ACIOL anterior chamber intraocular lens; IOL intraocular lens; PCIOL posterior chamber intraocular lens; Phaco/IOL phacoemulsification with intraocular lens placement; [place] photorefractive keratectomy; LASIK laser assisted in situ keratomileusis; HTN hypertension; DM diabetes mellitus; COPD chronic obstructive pulmonary disease

## 2022-07-30 ENCOUNTER — Encounter (INDEPENDENT_AMBULATORY_CARE_PROVIDER_SITE_OTHER): Payer: Medicare Other | Admitting: Ophthalmology

## 2022-07-30 DIAGNOSIS — Z961 Presence of intraocular lens: Secondary | ICD-10-CM

## 2022-07-30 DIAGNOSIS — H35373 Puckering of macula, bilateral: Secondary | ICD-10-CM

## 2022-07-30 DIAGNOSIS — Z9889 Other specified postprocedural states: Secondary | ICD-10-CM

## 2022-07-30 DIAGNOSIS — H43813 Vitreous degeneration, bilateral: Secondary | ICD-10-CM
